# Patient Record
Sex: Male | Born: 1980 | Race: White | Hispanic: No | Marital: Married | Smoking: Never smoker
Health system: Southern US, Community
[De-identification: ages and names within clinical notes are randomized; demographics above are authoritative.]

## PROBLEM LIST (undated history)

## (undated) HISTORY — PX: TONSILLECTOMY: SUR1361

---

## 2009-12-13 ENCOUNTER — Ambulatory Visit: Payer: Self-pay | Admitting: Internal Medicine

## 2011-04-24 ENCOUNTER — Ambulatory Visit: Payer: Self-pay | Admitting: Internal Medicine

## 2014-12-22 ENCOUNTER — Encounter: Payer: Self-pay | Admitting: Internal Medicine

## 2014-12-22 ENCOUNTER — Ambulatory Visit (INDEPENDENT_AMBULATORY_CARE_PROVIDER_SITE_OTHER): Payer: BLUE CROSS/BLUE SHIELD | Admitting: Family Medicine

## 2014-12-22 ENCOUNTER — Encounter: Payer: Self-pay | Admitting: Family Medicine

## 2014-12-22 VITALS — BP 120/64 | HR 72 | Temp 98.1°F | Ht 72.0 in | Wt 217.6 lb

## 2014-12-22 DIAGNOSIS — J3089 Other allergic rhinitis: Secondary | ICD-10-CM | POA: Insufficient documentation

## 2014-12-22 DIAGNOSIS — L309 Dermatitis, unspecified: Secondary | ICD-10-CM | POA: Insufficient documentation

## 2014-12-22 DIAGNOSIS — J029 Acute pharyngitis, unspecified: Secondary | ICD-10-CM

## 2014-12-22 DIAGNOSIS — R61 Generalized hyperhidrosis: Secondary | ICD-10-CM | POA: Insufficient documentation

## 2014-12-22 MED ORDER — PENICILLIN V POTASSIUM 500 MG PO TABS
500.0000 mg | ORAL_TABLET | Freq: Two times a day (BID) | ORAL | Status: DC
Start: 2014-12-22 — End: 2017-02-01

## 2014-12-22 NOTE — Progress Notes (Signed)
Date:  12/22/2014   Name:  Matthew Barnett   DOB:  1981-02-24   MRN:  [id number]  PCP:  Bari Edward, MD    Chief Complaint: Sore Throat   History of Present Illness:  This is a 34 y.o. male with 1d hx sore throat, fever to 102 overnight, no rhinorrhea or cough. Son just dx'd with strep throat at peds this morning (RST negative but signs c/w with strep). States allergic to Ceclor but doesn't remember reaction.  Review of Systems:  Review of Systems  Patient Active Problem List   Diagnosis Date Noted  . 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 Acute dermatitis 12/22/2014  . 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 Authorizing Provider  fluticasone (FLONASE) 50 MCG/ACT nasal spray Place 2 sprays into the nose daily at 2 PM daily at 2 PM. 09/05/12   Historical Provider, MD  penicillin v potassium (VEETID) 500 MG tablet Take 1 tablet (500 mg total) by mouth 2 (two) times daily. 12/22/14   Schuyler Amor, MD    Allergies  Allergen Reactions  . Ceclor [Cefaclor]     Past Surgical History  Procedure Laterality Date  . Tonsillectomy      Social History  Substance Use Topics  . Smoking status: Never Smoker   . Smokeless tobacco: Not on file  . Alcohol Use: No    No family history on file.  Medication list has been reviewed and updated.  Physical Examination: BP 120/64 mmHg  Pulse 72  Temp(Src) 98.1 F (36.7 C)  Ht 6' (1.829 m)  Wt 217 lb 9.6 oz (98.703 kg)  BMI 29.51 kg/m2  SpO2 98%  Physical Exam  Constitutional: He appears well-developed and well-nourished.  HENT:  OP erythema no exudate No sinus tenderness  Pulmonary/Chest: Effort normal and breath sounds normal.  Lymphadenopathy:    He has no cervical adenopathy.  Neurological: He is alert.  Psychiatric: He has a normal mood and affect. His behavior is normal.    Assessment and Plan:  1. Pharyngitis RST negative but son dx'd with strep  throat this AM, will cover with abx given lack of other viral sxs   Return if symptoms worsen or fail to improve.  Dionne Ano. Kingsley Spittle MD [facility]  12/22/2014

## 2015-04-03 ENCOUNTER — Ambulatory Visit
Admission: EM | Admit: 2015-04-03 | Discharge: 2015-04-03 | Disposition: A | Discharge status: Home / Self Care | Payer: BLUE CROSS/BLUE SHIELD | Attending: Family Medicine | Admitting: Family Medicine

## 2015-04-03 ENCOUNTER — Encounter: Payer: Self-pay | Admitting: Emergency Medicine

## 2015-04-03 DIAGNOSIS — J0110 Acute frontal sinusitis, unspecified: Secondary | ICD-10-CM

## 2015-04-03 DIAGNOSIS — J0100 Acute maxillary sinusitis, unspecified: Secondary | ICD-10-CM | POA: Diagnosis not present

## 2015-04-03 MED ORDER — AMOXICILLIN-POT CLAVULANATE 875-125 MG PO TABS: 1.0000 | ORAL_TABLET | Freq: Two times a day (BID) | ORAL | Status: DC

## 2015-04-03 NOTE — ED Notes (Signed)
Patient c/o sinus congestion and pressure and nasal congestion and HAs for a week.

## 2015-04-03 NOTE — ED Provider Notes (Signed)
[facility]  ____________________________________________  Time seen: Approximately 10:02 AM  I have reviewed the triage vital signs and the nursing notes.   HISTORY  Chief Complaint Facial Pain and Nasal Congestion   HPI KEITON COSMA is a 34 y.o. male presents for the complaints of 1-1.5 weeks of runny nose, nasal congestion and sinus pressure. Reports gradual onset of thick greenish nasal drainage. Also reports that now with frequent sinus pressure described as a aching pressure pain at 5 out of 10 around his face. Reports continues to eat and drink well. Denies known fevers. Denies chest pain, shortness breath, abdominal pain, neck pain, back pain, weakness or dizziness.   History reviewed. No pertinent past medical history.  Patient Active Problem List   Diagnosis Date Noted  . 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 Acute dermatitis 12/22/2014  . Hyperhidrosis 12/22/2014    Past Surgical History  Procedure Laterality Date  . Tonsillectomy      Current Outpatient Rx  Name  Route  Sig  Dispense  Refill                          Allergies Ceclor  History reviewed. No pertinent family history.  Social History Social History  Substance Use Topics  . Smoking status: Never Smoker   . Smokeless tobacco: None  . Alcohol Use: No    Review of Systems Constitutional: No fever/chills Eyes: No visual changes. ENT: No sore throat. Positive for runny nose, nasal congestion and sinus pressure. Cardiovascular: Denies chest pain. Respiratory: Denies shortness of breath. Gastrointestinal: No abdominal pain.  No nausea, no vomiting.  No diarrhea.  No constipation. Genitourinary: Negative for dysuria. Musculoskeletal: Negative for back pain. Skin: Negative for rash. Neurological: Negative for headaches, focal weakness or numbness.  10-point ROS otherwise negative.  ____________________________________________   PHYSICAL EXAM:  VITAL SIGNS: ED Triage  Vitals  Enc Vitals Group     BP 04/03/15 0926 131/86 mmHg     Pulse Rate 04/03/15 0926 55     Resp 04/03/15 0926 16     Temp 04/03/15 0926 96.8 F (36 C)     Temp Source 04/03/15 0926 Tympanic     SpO2 04/03/15 0926 100 %     Weight 04/03/15 0926 220 lb (99.791 kg)     Height 04/03/15 0926 6' (1.829 m)     Head Cir --      Peak Flow --      Pain Score --      Pain Loc --      Pain Edu? --      Excl. in GC? --   Constitutional: Alert and oriented. Well appearing and in no acute distress. Eyes: Conjunctivae are normal. PERRL. EOMI. Head: Atraumatic.Moderate tenderness to palpation bilateral frontal and maxillary sinuses. No swelling. No erythema.  Ears: no erythema, normal TMs bilaterally.   Nose:Nasal congestion with bilateral nasal turbinate erythema. Greenish nasal drainage.  Mouth/Throat: Mucous membranes are moist.  Oropharynx non-erythematous.No tonsillar swelling or exudate.  Neck: No stridor.  No cervical spine tenderness to palpation. Hematological/Lymphatic/Immunilogical: No cervical lymphadenopathy. Cardiovascular: Normal rate, regular rhythm. Grossly normal heart sounds.  Good peripheral circulation. Respiratory: Normal respiratory effort.  No retractions. Lungs CTAB.No wheezes, rales or rhonchi. Good air movement.  Gastrointestinal: Soft and nontender. No distention. Normal Bowel sounds.No CVA tenderness. Musculoskeletal: No lower or upper extremity tenderness nor edema.  No joint effusions. Bilateral pedal pulses equal and easily palpated.  Neurologic:  Normal speech and language. No gross focal neurologic deficits are appreciated. No gait instability. Skin:  Skin is warm, dry and intact. No rash noted. Psychiatric: Mood and affect are normal. Speech and behavior are normal.  LABS (all labs ordered are listed, but only abnormal results are displayed)  Labs Reviewed - No data to display ____________________________________________  INITIAL IMPRESSION / ASSESSMENT AND  PLAN / ED COURSE  Pertinent labs & imaging results that were available during my care of the patient were reviewed by me and considered in my medical decision making (see chart for details).  Very well-appearing patient. No acute distress. Presents for the complaints of over a week of runny nose, nasal congestion and sinus pressure. Will treat maxillary and frontal sinusitis with oral Augmentin and encouraged supportive treatments including over-the-counter Tylenol or ibuprofen as needed, rest, fluids and PCP follow-up.  Discussed follow up with Primary care physician this week. Discussed follow up and return parameters including no resolution or any worsening concerns. Patient verbalized understanding and agreed to plan.   ____________________________________________   FINAL CLINICAL IMPRESSION(S) / ED DIAGNOSES  Final diagnoses:  Acute frontal sinusitis, recurrence not specified  Acute maxillary sinusitis, recurrence not specified       Renford DillsLindsey Koty Anctil, NP 04/03/15 1125

## 2015-04-03 NOTE — Discharge Instructions (Signed)
Take medication as prescribed. Take over the counter tylenol or ibuprofen as needed. Rest. Drink plenty.   Follow up with your primary care physician as needed. Return to urgent care as needed for new or worsening concerns.   Sinusitis, Adult Sinusitis is redness, soreness, and inflammation of the paranasal sinuses. Paranasal sinuses are air pockets within the bones of your face. They are located beneath your eyes, in the middle of your forehead, and above your eyes. In healthy paranasal sinuses, mucus is able to drain out, and air is able to circulate through them by way of your nose. However, when your paranasal sinuses are inflamed, mucus and air can become trapped. This can allow bacteria and other germs to grow and cause infection. Sinusitis can develop quickly and last only a short time (acute) or continue over a long period (chronic). Sinusitis that lasts for more than 12 weeks is considered chronic. CAUSES Causes of sinusitis include:  Allergies.  Structural abnormalities, such as displacement of the cartilage that separates your nostrils (deviated septum), which can decrease the air flow through your nose and sinuses and affect sinus drainage.  Functional abnormalities, such as when the small hairs (cilia) that line your sinuses and help remove mucus do not work properly or are not present. SIGNS AND SYMPTOMS Symptoms of acute and chronic sinusitis are the same. The primary symptoms are pain and pressure around the affected sinuses. Other symptoms include:  Upper toothache.  Earache.  Headache.  Bad breath.  Decreased sense of smell and taste.  A cough, which worsens when you are lying flat.  Fatigue.  Fever.  Thick drainage from your nose, which often is green and may contain pus (purulent).  Swelling and warmth over the affected sinuses. DIAGNOSIS Your health care provider will perform a physical exam. During your exam, your health care provider may perform any of the  following to help determine if you have acute sinusitis or chronic sinusitis:  Look in your nose for signs of abnormal growths in your nostrils (nasal polyps).  Tap over the affected sinus to check for signs of infection.  View the inside of your sinuses using an imaging device that has a light attached (endoscope). If your health care provider suspects that you have chronic sinusitis, one or more of the following tests may be recommended:  Allergy tests.  Nasal culture. A sample of mucus is taken from your nose, sent to a lab, and screened for bacteria.  Nasal cytology. A sample of mucus is taken from your nose and examined by your health care provider to determine if your sinusitis is related to an allergy. TREATMENT Most cases of acute sinusitis are related to a viral infection and will resolve on their own within 10 days. Sometimes, medicines are prescribed to help relieve symptoms of both acute and chronic sinusitis. These may include pain medicines, decongestants, nasal steroid sprays, or saline sprays. However, for sinusitis related to a bacterial infection, your health care provider will prescribe antibiotic medicines. These are medicines that will help kill the bacteria causing the infection. Rarely, sinusitis is caused by a fungal infection. In these cases, your health care provider will prescribe antifungal medicine. For some cases of chronic sinusitis, surgery is needed. Generally, these are cases in which sinusitis recurs more than 3 times per year, despite other treatments. HOME CARE INSTRUCTIONS  Drink plenty of water. Water helps thin the mucus so your sinuses can drain more easily.  Use a humidifier.  Inhale steam 3-4 times  a day (for example, sit in the bathroom with the shower running).  Apply a warm, moist washcloth to your face 3-4 times a day, or as directed by your health care provider.  Use saline nasal sprays to help moisten and clean your sinuses.  Take  medicines only as directed by your health care provider.  If you were prescribed either an antibiotic or antifungal medicine, finish it all even if you start to feel better. SEEK IMMEDIATE MEDICAL CARE IF:  You have increasing pain or severe headaches.  You have nausea, vomiting, or drowsiness.  You have swelling around your face.  You have vision problems.  You have a stiff neck.  You have difficulty breathing.   This information is not intended to replace advice given to you by your health care provider. Make sure you discuss any questions you have with your health care provider.   Document Released: 04/18/2005 Document Revised: 05/09/2014 Document Reviewed: 05/03/2011 Elsevier Interactive Patient Education [employer].

## 2016-04-03 ENCOUNTER — Ambulatory Visit
Admission: EM | Admit: 2016-04-03 | Discharge: 2016-04-03 | Disposition: A | Discharge status: Home / Self Care | Payer: BLUE CROSS/BLUE SHIELD | Attending: Family Medicine | Admitting: Family Medicine

## 2016-04-03 ENCOUNTER — Encounter: Payer: Self-pay | Admitting: Gynecology

## 2016-04-03 DIAGNOSIS — J111 Influenza due to unidentified influenza virus with other respiratory manifestations: Secondary | ICD-10-CM

## 2016-04-03 DIAGNOSIS — R69 Illness, unspecified: Secondary | ICD-10-CM | POA: Diagnosis not present

## 2016-04-03 LAB — RAPID INFLUENZA A&B ANTIGENS (ARMC ONLY)
INFLUENZA A (ARMC): NEGATIVE
INFLUENZA B (ARMC): NEGATIVE

## 2016-04-03 LAB — RAPID STREP SCREEN (MED CTR MEBANE ONLY): STREPTOCOCCUS, GROUP A SCREEN (DIRECT): NEGATIVE

## 2016-04-03 MED ORDER — OSELTAMIVIR PHOSPHATE 75 MG PO CAPS: 75.0000 mg | ORAL_CAPSULE | Freq: Two times a day (BID) | ORAL | 0 refills | Status: DC

## 2016-04-03 NOTE — ED Provider Notes (Signed)
[facility] ____________________________________________  Time seen: Approximately 9:55 AM  I have reviewed the triage vital signs and the nursing notes.   HISTORY  Chief Complaint Generalized Body Aches and Sore Throat   HPI Matthew Barnett is a 35 y.o. male presents for the complaint of sudden onset of cough, congestion, sore throat and body aches yesterday afternoon. Patient reports prior to that he felt well. Patient reports that he has recently been around of other people in the community. Patient states that he woke up last night, his wife checked his temperature, and noted to have a 102.4 fever orally. Reports last dose of ibuprofen at approximately 8 AM. Patient reports right now he is feeling better but continues to have some body aches. States minimal sore throat.  Denies chest pain, shortness of breath, abdominal pain, dysuria, neck pain, back pain, extremity pain or extremity swelling.  Bari EdwardLaura Berglund, MD: PCP   History reviewed. No pertinent past medical history.  Patient Active Problem List   Diagnosis Date Noted  . 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 Acute dermatitis 12/22/2014  . Hyperhidrosis 12/22/2014    Past Surgical History:  Procedure Laterality Date  . TONSILLECTOMY       No current facility-administered medications for this encounter.   Current Outpatient Prescriptions:  .  none  Allergies Ceclor [cefaclor]  No family history on file.  Social History Social History  Substance Use Topics  . Smoking status: Never Smoker  . Smokeless tobacco: Never Used  . Alcohol use No    Review of Systems Constitutional:  As above.  Eyes: No visual changes. ENT: As above.  Cardiovascular: Denies chest pain. Respiratory: Denies shortness of breath. Gastrointestinal: No abdominal pain.  No nausea, no vomiting.  No diarrhea.  No constipation. Genitourinary: Negative for dysuria. Musculoskeletal: Negative for back pain. Skin:  Negative for rash. Neurological: Negative for headaches, focal weakness or numbness.  10-point ROS otherwise negative.  ____________________________________________   PHYSICAL EXAM:  VITAL SIGNS: ED Triage Vitals  Enc Vitals Group     BP 04/03/16 0936 121/73     Pulse Rate 04/03/16 0936 89     Resp 04/03/16 0936 18     Temp 04/03/16 0936 97.7 F (36.5 C)     Temp Source 04/03/16 0936 Oral     SpO2 04/03/16 0936 98 %     Weight 04/03/16 0938 220 lb (99.8 kg)     Height 04/03/16 0938 6' (1.829 m)     Head Circumference --      Peak Flow --      Pain Score 04/03/16 0939 3     Pain Loc --      Pain Edu? --      Excl. in GC? --    Constitutional: Alert and oriented. Well appearing and in no acute distress. Eyes: Conjunctivae are normal. PERRL. EOMI. Head: Atraumatic. No sinus tenderness to palpation. No swelling. No erythema.  Ears: no erythema, normal TMs bilaterally.   Nose:Mild nasal congestion.   Mouth/Throat: Mucous membranes are moist. Mild pharyngeal erythema. No tonsillar swelling or exudate.  Neck: No stridor.  No cervical spine tenderness to palpation. Hematological/Lymphatic/Immunilogical: No cervical lymphadenopathy. Cardiovascular: Normal rate, regular rhythm. Grossly normal heart sounds.  Good peripheral circulation. Respiratory: Normal respiratory effort.  No retractions. Lungs CTAB.No wheezes, rales or rhonchi. Good air movement.  Gastrointestinal: Soft and nontender. No CVA tenderness. Musculoskeletal: No lower or upper extremity tenderness nor edema. No cervical, thoracic or lumbar tenderness to palpation. Neurologic:  Normal speech and language. No gross focal neurologic deficits are appreciated. No gait instability.No meningismus.  Skin:  Skin is warm, dry and intact. No rash noted. Psychiatric: Mood and affect are normal. Speech and behavior are normal.  ___________________________________________   LABS (all labs ordered are listed, but only abnormal  results are displayed)  Labs Reviewed  RAPID INFLUENZA A&B ANTIGENS ([facility] ONLY)  RAPID STREP SCREEN (NOT AT [facility])  CULTURE, GROUP A STREP [facility])     PROCEDURES Procedures    INITIAL IMPRESSION / ASSESSMENT AND PLAN / ED COURSE  Pertinent labs & imaging results that were available during my care of the patient were reviewed by me and considered in my medical decision making (see chart for details).  Very well-appearing patient. No acute distress. Presents for complaints of acute onset of cough, congestion, body aches and fever. Reports of 102.4 oral fever last night. Exam reassuring. Suspect flulike illness. Quick strep negative, will culture. Influenza negative. However discussed with patient suspect flulike illness, discussed use of Tamiflu. Patient requests prescription for Tamiflu. Denies need for other prescription medications and states will take over-the-counter medications as needed.Discussed indication, risks and benefits of medications with patient. Encouraged rest, fluids and PCP follow-up as needed.  Discussed follow up with Primary care physician this week. Discussed follow up and return parameters including no resolution or any worsening concerns. Patient verbalized understanding and agreed to plan.   ____________________________________________   FINAL CLINICAL IMPRESSION(S) / ED DIAGNOSES  Final diagnoses:  Influenza-like illness     Discharge Medication List as of 04/03/2016  9:56 AM    START taking these medications   Details  oseltamivir (TAMIFLU) 75 MG capsule Take 1 capsule (75 mg total) by mouth every 12 (twelve) hours., Starting Sun 04/03/2016, Normal        Note: This dictation was prepared with Dragon dictation along with smaller phrase technology. Any transcriptional errors that result from this process are unintentional.    Clinical Course       Renford DillsLindsey Vanita Cannell, NP 04/03/16 1030

## 2016-04-03 NOTE — Discharge Instructions (Signed)
Take medication as prescribed. Rest. Drink plenty of fluids.  ° °Follow up with your primary care physician this week as needed. Return to Urgent care for new or worsening concerns.  ° °

## 2016-04-03 NOTE — ED Triage Notes (Signed)
Patient c/o body ache/ sore throat and fever at home of 102.4 this morning.

## 2016-04-06 ENCOUNTER — Telehealth: Payer: Self-pay | Admitting: Emergency Medicine

## 2016-04-06 LAB — CULTURE, GROUP A STREP (THRC)

## 2016-04-06 NOTE — Telephone Encounter (Signed)
Patient notified that his throat culture was negative.  Patient states that he is feeling better.  Patient to follow-up here or with PCP if symptoms worsen.  Patient verbalized understanding.

## 2017-02-01 ENCOUNTER — Other Ambulatory Visit: Payer: Self-pay | Admitting: Internal Medicine

## 2017-02-01 ENCOUNTER — Encounter: Payer: Self-pay | Admitting: Internal Medicine

## 2017-02-01 ENCOUNTER — Ambulatory Visit (INDEPENDENT_AMBULATORY_CARE_PROVIDER_SITE_OTHER): Payer: BLUE CROSS/BLUE SHIELD | Admitting: Internal Medicine

## 2017-02-01 VITALS — BP 130/86 | HR 67 | Temp 98.1°F | Ht 72.0 in | Wt 224.0 lb

## 2017-02-01 DIAGNOSIS — H6691 Otitis media, unspecified, right ear: Secondary | ICD-10-CM | POA: Diagnosis not present

## 2017-02-01 DIAGNOSIS — H6121 Impacted cerumen, right ear: Secondary | ICD-10-CM | POA: Diagnosis not present

## 2017-02-01 DIAGNOSIS — J0100 Acute maxillary sinusitis, unspecified: Secondary | ICD-10-CM

## 2017-02-01 MED ORDER — AZITHROMYCIN 250 MG PO TABS: ORAL_TABLET | ORAL | 0 refills | Status: DC

## 2017-02-01 MED ORDER — NEOMYCIN-POLYMYXIN-HC 1 % OT SOLN: 3.0000 [drp] | Freq: Three times a day (TID) | OTIC | 0 refills | Status: DC

## 2017-02-01 NOTE — Progress Notes (Signed)
Date:  02/01/2017   Name:  Matthew Barnett   DOB:  1980/05/24   MRN:  [id number]   Chief Complaint: Sinusitis (Having sinus headaches. Last one was a few weeks ago. Blew nose and felt pressure shoot up nose and more headaches. Sometimes having a stuffy nose. No cough. ) and Ear Pain (Right Ear pain- feels clogged. On and off for 3 months. Stated tried putting peroxide in it but still clogged. Hearing less out of the ear. )  Sinusitis  This is a new problem. The current episode started 1 to 4 weeks ago. The problem has been waxing and waning since onset. There has been no fever. Associated symptoms include congestion, headaches and sinus pressure. Pertinent negatives include no chills, ear pain or shortness of breath. Past treatments include oral decongestants. The treatment provided mild relief.     Review of Systems  Constitutional: Negative for chills, fatigue and fever.  HENT: Positive for congestion, hearing loss and sinus pressure. Negative for ear pain and trouble swallowing.   Respiratory: Negative for chest tightness and shortness of breath.   Cardiovascular: Negative for chest pain and palpitations.  Gastrointestinal: Negative for diarrhea and nausea.  Neurological: Positive for headaches.    Patient Active Problem List   Diagnosis Date Noted  . Environmental and seasonal allergies 12/22/2014  . 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 Ceclor [Cefaclor]     Past Surgical History:  Procedure Laterality Date  . TONSILLECTOMY      Social History  Substance Use Topics  . Smoking status: Never Smoker  . Smokeless tobacco: Never Used  . Alcohol use No     Medication list has been reviewed and updated.  PHQ 2/9 Scores 02/01/2017  PHQ - 2 Score 0    Physical Exam  Constitutional: He is oriented to person, place, and time. He appears well-developed and well-nourished.  HENT:  Right Ear:  External ear normal.  Left Ear: Tympanic membrane, external ear and ear canal normal. Tympanic membrane is not erythematous and not retracted.  Nose: Right sinus exhibits frontal sinus tenderness. Right sinus exhibits no maxillary sinus tenderness. Left sinus exhibits no maxillary sinus tenderness and no frontal sinus tenderness.  Mouth/Throat: Uvula is midline and mucous membranes are normal. No oral lesions. Posterior oropharyngeal erythema present. No oropharyngeal exudate.  Impacted cerumen with yellow fluid oozing around on right Flushed with tepid water - large amount of cerumen removed.  Ear canal erythematous, TM scarred but not red/retracted  Cardiovascular: Normal rate, regular rhythm and normal heart sounds.   Pulmonary/Chest: Breath sounds normal. He has no wheezes. He has no rales.  Lymphadenopathy:    He has no cervical adenopathy.  Neurological: He is alert and oriented to person, place, and time.    BP 130/86   Pulse 67   Temp 98.1 F (36.7 C) (Oral)   Ht 6' (1.829 m)   Wt 224 lb (101.6 kg)   SpO2 97%   BMI 30.38 kg/m   Assessment and Plan: 1. Acute non-recurrent maxillary sinusitis - azithromycin (ZITHROMAX Z-PAK) 250 MG tablet; UAD  Dispense: 6 each; Refill: 0  2. Right otitis media, unspecified otitis media type - NEOMYCIN-POLYMYXIN-HYDROCORTISONE (CORTISPORIN) 1 % SOLN OTIC solution; Place 3 drops into the right ear every 8 (eight) hours.  Dispense: 10 mL; Refill: 0  3. Impacted cerumen of right ear Removed easily with flushing -  Meds ordered this encounter  Medications  . azithromycin (ZITHROMAX Z-PAK) 250 MG tablet    Sig: UAD    Dispense:  6 each    Refill:  0  . NEOMYCIN-POLYMYXIN-HYDROCORTISONE (CORTISPORIN) 1 % SOLN OTIC solution    Sig: Place 3 drops into the right ear every 8 (eight) hours.    Dispense:  10 mL    Refill:  0    Partially dictated using Animal nutritionist. Any errors are unintentional.  Bari Edward, MD [facility]  02/01/2017

## 2017-07-25 ENCOUNTER — Ambulatory Visit: Payer: Self-pay | Admitting: Internal Medicine

## 2017-07-26 ENCOUNTER — Ambulatory Visit (INDEPENDENT_AMBULATORY_CARE_PROVIDER_SITE_OTHER): Payer: BLUE CROSS/BLUE SHIELD | Admitting: Internal Medicine

## 2017-07-26 ENCOUNTER — Encounter: Payer: Self-pay | Admitting: Internal Medicine

## 2017-07-26 VITALS — BP 128/82 | HR 69 | Temp 97.7°F | Ht 72.0 in | Wt 224.0 lb

## 2017-07-26 DIAGNOSIS — J3089 Other allergic rhinitis: Secondary | ICD-10-CM | POA: Diagnosis not present

## 2017-07-26 DIAGNOSIS — H6121 Impacted cerumen, right ear: Secondary | ICD-10-CM

## 2017-07-26 NOTE — Progress Notes (Signed)
    Date:  07/26/2017   Name:  Matthew SartoriusRoger D Treanor   DOB:  12/21/1980   MRN:  [id number]   Chief Complaint: Otalgia (Sunday night Left ear has been hurting. Right ear feels like clogging back up. )  Otalgia   There is pain in the right ear. This is a new problem. The current episode started yesterday. The problem has been resolved. There has been no fever. Associated symptoms include headaches. Pertinent negatives include no coughing, ear discharge or sore throat. seasonal allergies      Review of Systems  Constitutional: Negative for chills, fatigue and fever.  HENT: Positive for ear pain. Negative for ear discharge, sinus pressure, sore throat and trouble swallowing.   Respiratory: Negative for cough, chest tightness and shortness of breath.   Cardiovascular: Negative for chest pain and palpitations.  Neurological: Positive for headaches.    Patient Active Problem List   Diagnosis Date Noted  . Environmental and seasonal allergies 12/22/2014  . 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 Ceclor [Cefaclor]     Past Surgical History:  Procedure Laterality Date  . TONSILLECTOMY      Social History   Tobacco Use  . Smoking status: Never Smoker  . Smokeless tobacco: Never Used  Substance Use Topics  . Alcohol use: No    Alcohol/week: 0.0 oz  . Drug use: No     Medication list has been reviewed and updated.  PHQ 2/9 Scores 02/01/2017  PHQ - 2 Score 0    Physical Exam  Constitutional: He is oriented to person, place, and time. He appears well-developed. No distress.  HENT:  Head: Normocephalic and atraumatic.  Left Ear: Tympanic membrane and ear canal normal.  Nose: Right sinus exhibits no maxillary sinus tenderness and no frontal sinus tenderness. Left sinus exhibits no maxillary sinus tenderness and no frontal sinus tenderness.  Mouth/Throat: No posterior oropharyngeal edema or posterior  oropharyngeal erythema.  Excessive cerumen on right  Cardiovascular: Normal rate, regular rhythm and normal heart sounds.  Pulmonary/Chest: Effort normal and breath sounds normal. No respiratory distress. He has no wheezes.  Musculoskeletal: Normal range of motion.  Neurological: He is alert and oriented to person, place, and time.  Skin: Skin is warm and dry. No rash noted.  Psychiatric: He has a normal mood and affect. His behavior is normal. Thought content normal.  Nursing note and vitals reviewed.   BP 128/82   Pulse 69   Temp 97.7 F (36.5 C) (Oral)   Ht 6' (1.829 m)   Wt 224 lb (101.6 kg)   SpO2 99%   BMI 30.38 kg/m   Assessment and Plan: 1. Excessive cerumen in right ear canal rec otc ear wax softener and flush  2. Environmental and seasonal allergies Continue claritin PRN   No orders of the defined types were placed in this encounter.   Partially dictated using Animal nutritionistDragon software. Any errors are unintentional.  Bari EdwardLaura Eriko Economos, MD [facility]  07/26/2017

## 2018-02-10 ENCOUNTER — Encounter: Payer: Self-pay | Admitting: Emergency Medicine

## 2018-02-10 ENCOUNTER — Emergency Department: Payer: BLUE CROSS/BLUE SHIELD

## 2018-02-10 ENCOUNTER — Emergency Department
Admission: EM | Admit: 2018-02-10 | Discharge: 2018-02-10 | Disposition: A | Discharge status: Home / Self Care | Payer: BLUE CROSS/BLUE SHIELD | Attending: Emergency Medicine | Admitting: Emergency Medicine

## 2018-02-10 DIAGNOSIS — Y92002 Bathroom of unspecified non-institutional (private) residence single-family (private) house as the place of occurrence of the external cause: Secondary | ICD-10-CM | POA: Diagnosis not present

## 2018-02-10 DIAGNOSIS — R55 Syncope and collapse: Secondary | ICD-10-CM | POA: Diagnosis not present

## 2018-02-10 DIAGNOSIS — W010XXA Fall on same level from slipping, tripping and stumbling without subsequent striking against object, initial encounter: Secondary | ICD-10-CM | POA: Diagnosis not present

## 2018-02-10 DIAGNOSIS — S0083XA Contusion of other part of head, initial encounter: Secondary | ICD-10-CM | POA: Diagnosis not present

## 2018-02-10 DIAGNOSIS — S0990XA Unspecified injury of head, initial encounter: Secondary | ICD-10-CM

## 2018-02-10 DIAGNOSIS — R569 Unspecified convulsions: Secondary | ICD-10-CM | POA: Diagnosis not present

## 2018-02-10 DIAGNOSIS — Y999 Unspecified external cause status: Secondary | ICD-10-CM | POA: Insufficient documentation

## 2018-02-10 DIAGNOSIS — Y9389 Activity, other specified: Secondary | ICD-10-CM | POA: Insufficient documentation

## 2018-02-10 DIAGNOSIS — M542 Cervicalgia: Secondary | ICD-10-CM | POA: Diagnosis not present

## 2018-02-10 DIAGNOSIS — S0993XA Unspecified injury of face, initial encounter: Secondary | ICD-10-CM | POA: Diagnosis not present

## 2018-02-10 DIAGNOSIS — S199XXA Unspecified injury of neck, initial encounter: Secondary | ICD-10-CM | POA: Diagnosis not present

## 2018-02-10 DIAGNOSIS — S00512A Abrasion of oral cavity, initial encounter: Secondary | ICD-10-CM | POA: Diagnosis not present

## 2018-02-10 LAB — BASIC METABOLIC PANEL
Anion gap: 5 (ref 5–15)
BUN: 24 mg/dL — AB (ref 6–20)
CALCIUM: 8.9 mg/dL (ref 8.9–10.3)
CHLORIDE: 109 mmol/L (ref 98–111)
CO2: 26 mmol/L (ref 22–32)
CREATININE: 1 mg/dL (ref 0.61–1.24)
GFR calc non Af Amer: >60 mL/min (ref >60)
Glucose, Bld: 143 mg/dL — ABNORMAL HIGH (ref 70–99)
Potassium: 3.6 mmol/L (ref 3.5–5.1)
SODIUM: 140 mmol/L (ref 135–145)

## 2018-02-10 LAB — CBC
HEMATOCRIT: 40.5 % (ref 39.0–52.0)
Hemoglobin: 14.2 g/dL (ref 13.0–17.0)
MCH: 31.6 pg (ref 26.0–34.0)
MCHC: 35.1 g/dL (ref 30.0–36.0)
MCV: 90.2 fL (ref 80.0–100.0)
PLATELETS: 215 10*3/uL (ref 150–400)
RBC: 4.49 MIL/uL (ref 4.22–5.81)
RDW: 12.4 % (ref 11.5–15.5)
WBC: 9.7 10*3/uL (ref 4.0–10.5)
nRBC: 0 % (ref 0.0–0.2)

## 2018-02-10 NOTE — ED Notes (Signed)
PT TO CT AT THIS TIME

## 2018-02-10 NOTE — ED Provider Notes (Signed)
[facility] Emergency Department Provider Note   ____________________________________________   First MD Initiated Contact with Patient 02/10/18 [id number]     (approximate)  I have reviewed the triage vital signs and the nursing notes.   HISTORY  Chief Complaint Loss of Consciousness    HPI Matthew Barnett is a 37 y.o. male here for evaluation after he passed out after using the bathroom  Patient reports he got up during the night to have a bowel movement, while he was getting up from the toilet he became very lightheaded and fell forward. On the floor and his wife heard him collapse.  He was able to crawl to the door and open it for her.  He tried to get up once again and then got very lightheaded and fell back and passed out.  Patient reports he is not certain he passed out during the first episode but he is pretty sure he did a second time and his white witnessed him go out for at least a few seconds to a minute.  Reports the front of his head has a small amount of bruising over it, no chest pain or trouble breathing.  No abdominal pain nausea or vomiting.  Ports he had a normal bowel movement and was getting up and became lightheaded.  He also reports he had 2 previous episodes that are about the same where years ago he passed out both times while using the bathroom.   He did not urinate on himself.  No history of any seizures.  Wife reports he had a very brief shaking-like episode but awoke oriented immediately after passing out.  History reviewed. No pertinent past medical history.  Patient Active Problem List   Diagnosis Date Noted  . Environmental and seasonal allergies 12/22/2014  . Hyperhidrosis 12/22/2014    Past Surgical History:  Procedure Laterality Date  . TONSILLECTOMY      Prior to Admission medications   Not on File    Allergies Ceclor [cefaclor]  No family history on file.  Social History Social History   Tobacco Use  .  Smoking status: Never Smoker  . Smokeless tobacco: Never Used  Substance Use Topics  . Alcohol use: No    Alcohol/week: 0.0 standard drinks  . Drug use: No    Review of Systems Constitutional: No fever/chills Eyes: No visual changes. ENT: No sore throat. Cardiovascular: Denies chest pain. Respiratory: Denies shortness of breath. Gastrointestinal: No abdominal pain.   Genitourinary: Negative for dysuria. Musculoskeletal: Negative for back pain.  Reports his neck feels just slightly sore primarily over the left side. Skin: Negative for rash. Neurological: Negative for headaches, areas of focal weakness or numbness.    ____________________________________________   PHYSICAL EXAM:  VITAL SIGNS: ED Triage Vitals  Enc Vitals Group     BP 02/10/18 0156 (!) 142/85     Pulse Rate 02/10/18 0156 70     Resp 02/10/18 0156 20     Temp 02/10/18 0156 98.3 F (36.8 C)     Temp Source 02/10/18 0156 Oral     SpO2 02/10/18 0156 98 %     Weight 02/10/18 0155 220 lb (99.8 kg)     Height 02/10/18 0155 6' (1.829 m)     Head Circumference --      Peak Flow --      Pain Score 02/10/18 0154 2     Pain Loc --      Pain Edu? --  Excl. in GC? --     Constitutional: Alert and oriented. Well appearing and in no acute distress.  Patient is wife both very pleasant. Eyes: Conjunctivae are normal. Head: Atraumatic except for a very small hematoma just superior to the left orbital region without bruising or ecchymosis around the orbits. Nose: No congestion/rhinnorhea.  He is a very small abrasion over the tip of the tongue without dental injury. Mouth/Throat: Mucous membranes are moist. Neck: No stridor.  Cardiovascular: Normal rate, regular rhythm. Grossly normal heart sounds.  Good peripheral circulation. Respiratory: Normal respiratory effort.  No retractions. Lungs CTAB. Gastrointestinal: Soft and nontender. No distention. Musculoskeletal: No lower extremity tenderness nor  edema. Neurologic:  Normal speech and language. No gross focal neurologic deficits are appreciated.  Skin:  Skin is warm, dry and intact. No rash noted. Psychiatric: Mood and affect are normal. Speech and behavior are normal.  ____________________________________________   LABS (all labs ordered are listed, but only abnormal results are displayed)  Labs Reviewed  BASIC METABOLIC PANEL - Abnormal; Notable for the following components:      Result Value   Glucose, Bld 143 (*)    BUN 24 (*)    All other components within normal limits  CBC  URINALYSIS, COMPLETE (UACMP) WITH MICROSCOPIC   ____________________________________________  EKG  Reviewed and entered by me at 2 AM Heart rate 70 QRS 100 QTc 420 Normal sinus rhythm, no evidence of ischemia or ectopy No WPW, prolonged QT or Brugada. ____________________________________________  RADIOLOGY     CT head and neck negative for acute ____________________________________________   PROCEDURES  Procedure(s) performed: None  Procedures  Critical Care performed: No  ____________________________________________   INITIAL IMPRESSION / ASSESSMENT AND PLAN / ED COURSE  Pertinent labs & imaging results that were available during my care of the patient were reviewed by me and considered in my medical decision making (see chart for details).   Patient with one near syncopal and one syncopal episode witnessed by his wife the latter.  Does not appear consistent with a seizure, appears he likely became hypotensive after defecation.  Alert well oriented with normal neurologic exam.  EKG reassuring without cardiac or pulmonary symptoms.  Normal vital signs.  Normal mental status.  Does have a slight abrasion to tip of his tongue which I suspect he sustained when falling in a small amount of hematoma overlying the left superior orbital region ----------------------------------------- 5:26 AM on  02/10/2018 ----------------------------------------- Discussed with patient, will follow up closely with primary provider.  Fully alert and oriented, since ambulated without distress discomfort or near syncope.  Return precautions and treatment recommendations and follow-up discussed with the patient who is agreeable with the plan.    ____________________________________________   FINAL CLINICAL IMPRESSION(S) / ED DIAGNOSES  Final diagnoses:  Vasovagal syncope  Closed head injury, initial encounter        Note:  This document was prepared using Dragon voice recognition software and may include unintentional dictation errors       Sharyn Creamer, MD 02/10/18 [phone]

## 2018-02-10 NOTE — ED Triage Notes (Signed)
Pt arrived from home. Pt was in bathroom having a bowel movement and wife heard pt collapse. Pt was found on floor convulsing and was unresponsive for 2 approximately 2 minutes. Pt arrives a & o x 4 with hematoma to forehead. Pt reports minimal pain. No neuro deficits at this time.

## 2018-04-16 ENCOUNTER — Encounter: Payer: Self-pay | Admitting: Internal Medicine

## 2018-04-16 ENCOUNTER — Ambulatory Visit (INDEPENDENT_AMBULATORY_CARE_PROVIDER_SITE_OTHER): Payer: BLUE CROSS/BLUE SHIELD | Admitting: Internal Medicine

## 2018-04-16 VITALS — BP 126/72 | HR 84 | Ht 72.0 in | Wt 233.0 lb

## 2018-04-16 DIAGNOSIS — R030 Elevated blood-pressure reading, without diagnosis of hypertension: Secondary | ICD-10-CM | POA: Diagnosis not present

## 2018-04-16 DIAGNOSIS — Z23 Encounter for immunization: Secondary | ICD-10-CM | POA: Diagnosis not present

## 2018-04-16 NOTE — Progress Notes (Signed)
    Date:  04/16/2018   Name:  Matthew SartoriusRoger D Barnett   DOB:  08/03/1980   MRN:  [id number]   Chief Complaint: Hypertension (Had blood drawn last week and BP was drawn after and it was elevated. )  Hypertension  This is a new problem. The problem has been resolved since onset. Pertinent negatives include no headaches, palpitations, peripheral edema or shortness of breath. There are no associated agents to hypertension. Past treatments include nothing.  [facility]95 but cam down to 138/88. He checked his BP at work several times and it was normal.  He feels well and believes it was just situational.  Review of Systems  Constitutional: Negative for chills, fatigue and fever.  Respiratory: Negative for chest tightness and shortness of breath.   Cardiovascular: Negative for palpitations and leg swelling.  Neurological: Negative for dizziness, light-headedness and headaches.    Patient Active Problem List   Diagnosis Date Noted  . Environmental and seasonal allergies 12/22/2014  . Hyperhidrosis 12/22/2014    Allergies  Allergen Reactions  . Ceclor [Cefaclor]     Past Surgical History:  Procedure Laterality Date  . TONSILLECTOMY      Social History   Tobacco Use  . Smoking status: Never Smoker  . Smokeless tobacco: Never Used  Substance Use Topics  . Alcohol use: No    Alcohol/week: 0.0 standard drinks  . Drug use: No     Medication list has been reviewed and updated.  No outpatient medications have been marked as taking for the 04/16/18 encounter (Office Visit) with Reubin MilanBerglund, Angelle Isais H, MD.    [facility] 04/16/2018 02/01/2017  PHQ - 2 Score 0 0    Physical Exam Vitals signs and nursing note reviewed.  Constitutional:      General: He is not in acute distress.    Appearance: He is well-developed.  HENT:     Head: Normocephalic and atraumatic.  Eyes:     Pupils: Pupils are equal, round, and reactive to light.  Neck:   Musculoskeletal: Normal range of motion and neck supple.     Vascular: No carotid bruit.  Cardiovascular:     Rate and Rhythm: Normal rate and regular rhythm.     Pulses: Normal pulses.  Pulmonary:     Effort: Pulmonary effort is normal. No respiratory distress.     Breath sounds: Normal breath sounds.  Musculoskeletal: Normal range of motion.     Right lower leg: No edema.     Left lower leg: No edema.  Skin:    General: Skin is warm and dry.  Neurological:     Mental Status: He is alert and oriented to person, place, and time.     BP 126/72 (BP Location: Right Arm, Patient Position: Sitting, Cuff Size: Normal)   Pulse 84   Ht 6' (1.829 m)   Wt 233 lb (105.7 kg)   SpO2 98%   BMI 31.60 kg/m   Assessment and Plan: 1. Elevated blood pressure, situational Resolved, no need for intervention  2. Need for immunization against influenza - Flu Vaccine QUAD 36+ mos IM   Partially dictated using Animal nutritionistDragon software. Any errors are unintentional.  Bari EdwardLaura Arabella Revelle, MD [facility]  04/16/2018

## 2019-05-09 IMAGING — CT CT CERVICAL SPINE W/O CM
4 of 7 series · 13 of 33 positions shown, 14 images · non-contrast
Comparison: None.

CLINICAL DATA: Patient collapse in was found convulsion and
unresponsive for about 2 minutes. Hematoma to the forehead.

EXAM:
CT HEAD WITHOUT CONTRAST
CT CERVICAL SPINE WITHOUT CONTRAST
TECHNIQUE: Multidetector CT imaging of the head and cervical spine was
performed following the standard protocol without intravenous
contrast. Multiplanar CT image reconstructions of the cervical spine
were also generated.

[Series 5: coronal soft tissue · coronal · 0.30mm/px · 2 of 64 slices shown]
[im 22/64  bone]
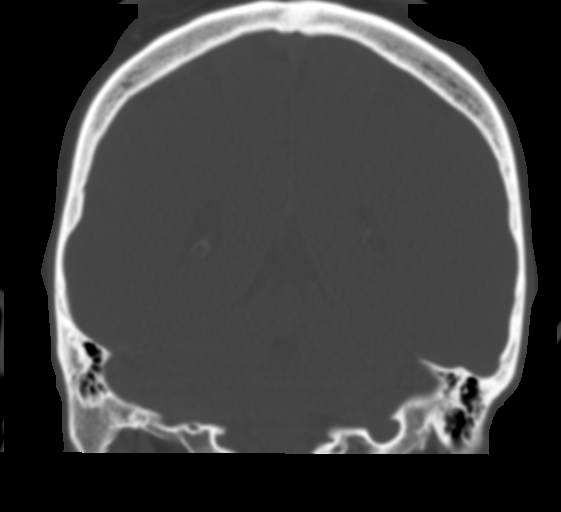
[im 43/64  bone]
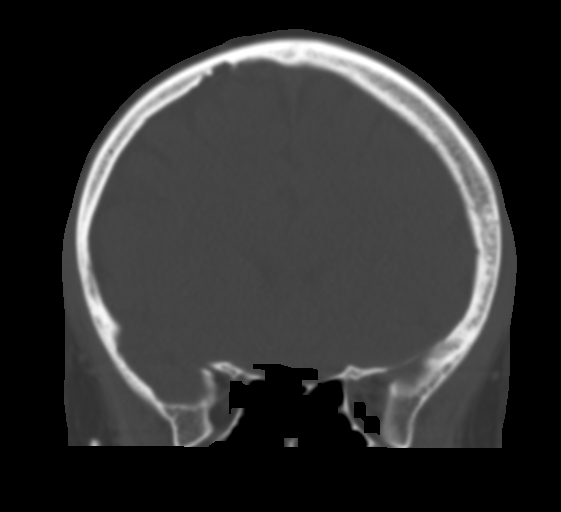

[Series 7: c spine soft · axial · 0.31mm/px · z∈[-200,-130]mm · 3 of 89 slices shown]
[im 18/89  soft-tissue]
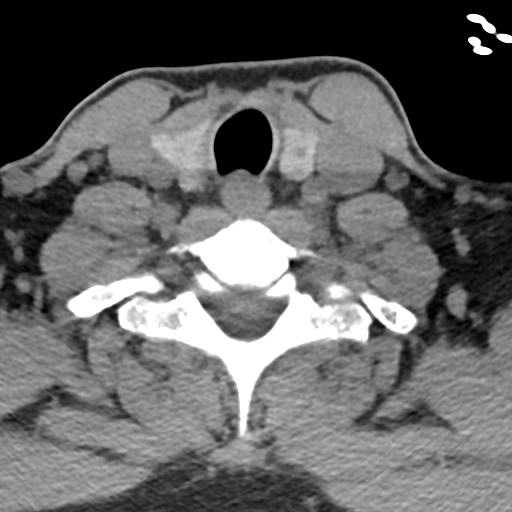
[im 36/89  soft-tissue]
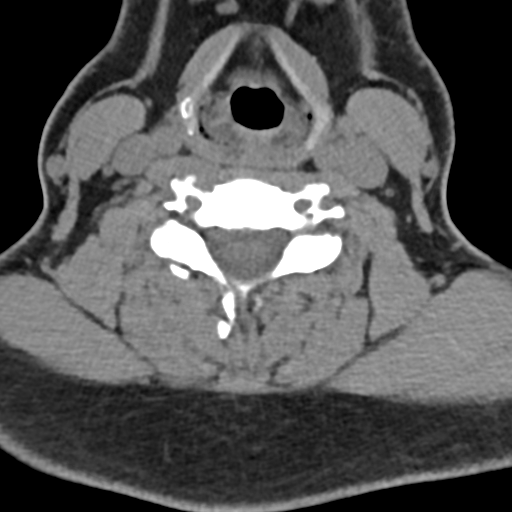
[im 53/89  soft-tissue]
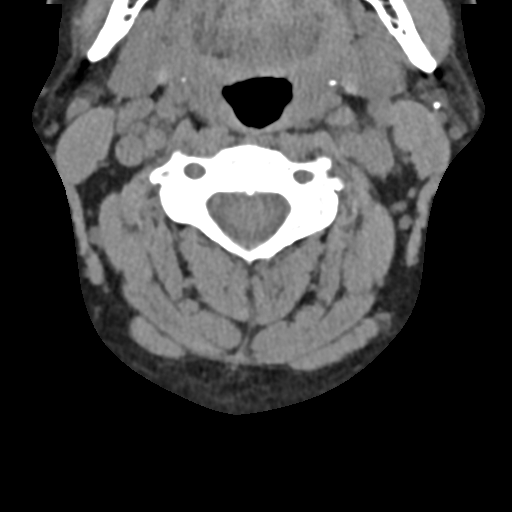

[Series 8: sagittal bone · sagittal · 0.24mm/px · 4 of 44 slices shown]
[im 9/44  bone]
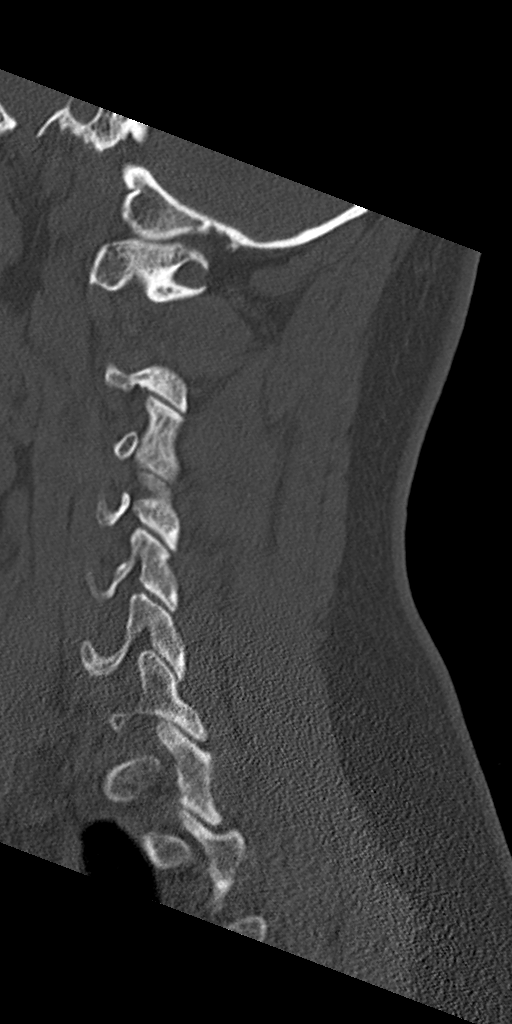
[im 18/44  bone]
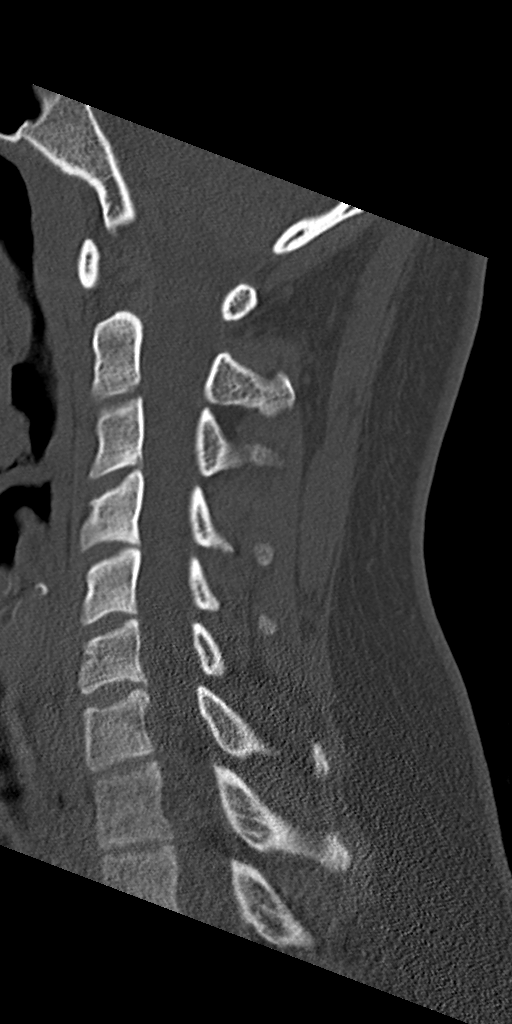
[im 26/44  bone]
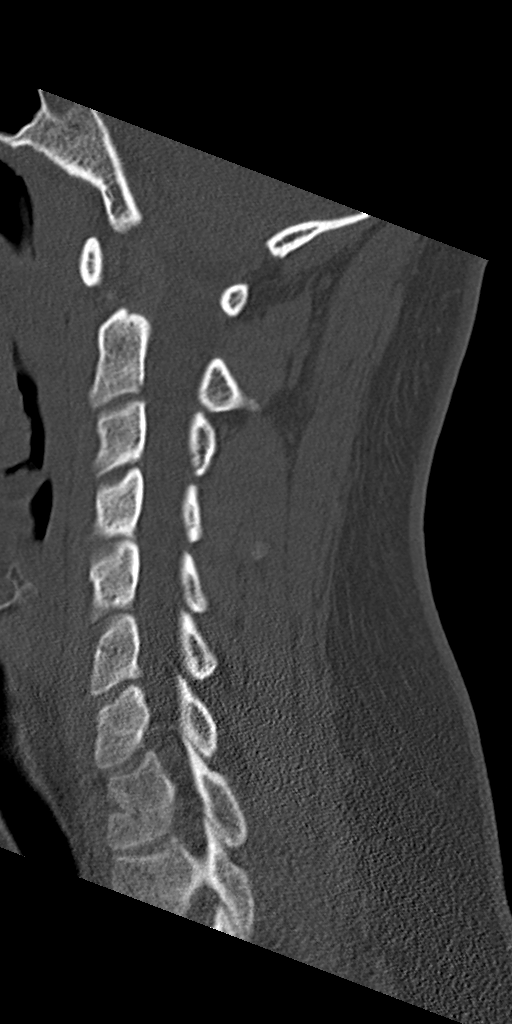
[im 35/44  bone]
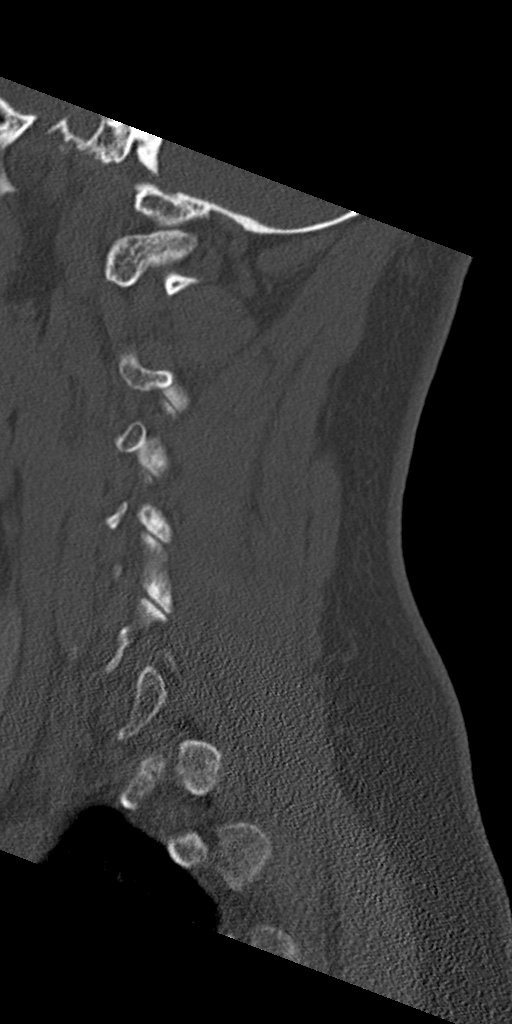

[Series 10: orthogonal bone · axial · 0.17mm/px · z∈[-212,-108]mm · 4 of 94 slices shown, 5 images]
[im 19/94  soft-tissue]
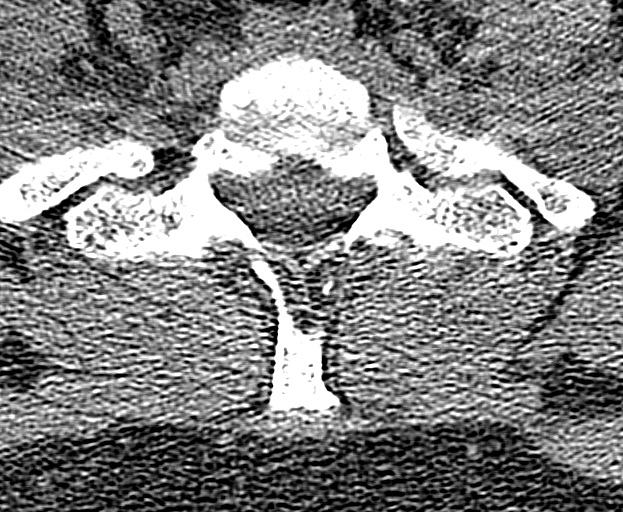
[im 19/94  bone]
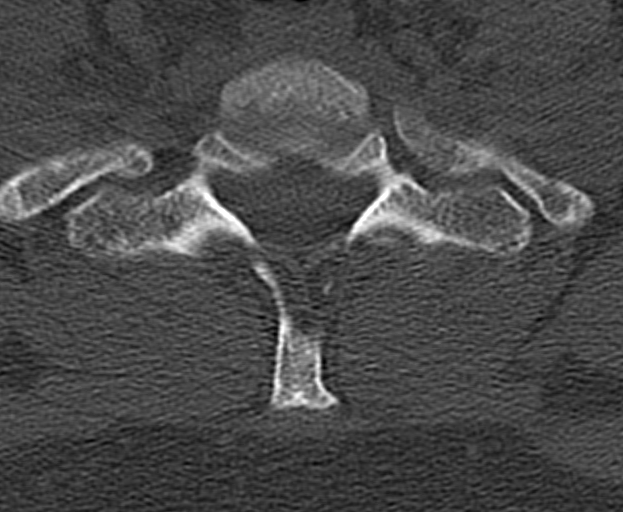
[im 38/94  bone]
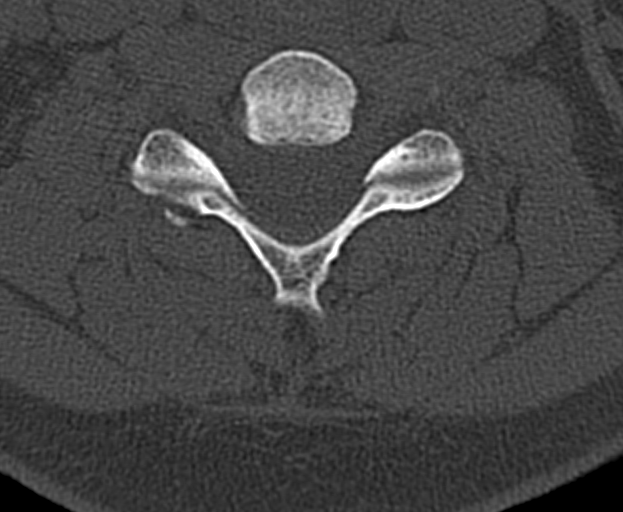
[im 56/94  bone]
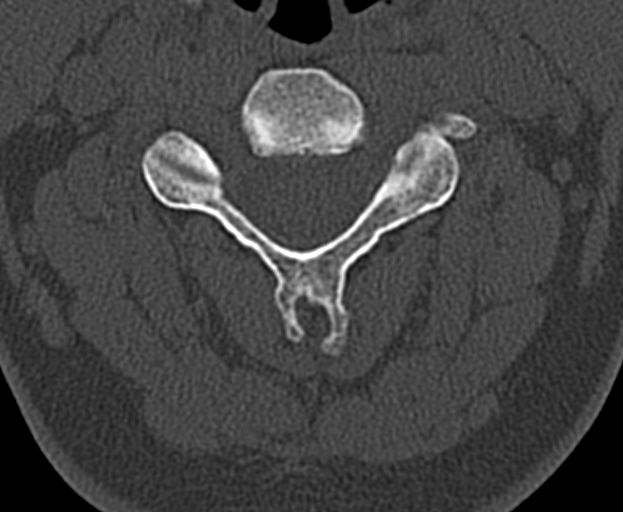
[im 75/94  bone]
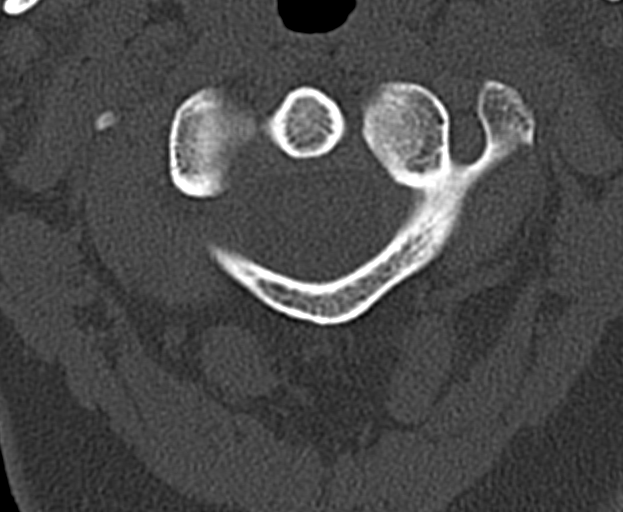

[13 of 33 positions shown; findings below may reference images not displayed]

FINDINGS: CT HEAD FINDINGS

Brain: No evidence of acute infarction, hemorrhage, hydrocephalus,
extra-axial collection or mass lesion/mass effect.

Vascular: No hyperdense vessel or unexpected calcification.

Skull: Normal. Negative for fracture or focal lesion.

Sinuses/Orbits: Mucosal thickening in the paranasal sinuses. No
acute air-fluid levels. Mastoid air cells are clear.

Other: Subcutaneous scalp nodules likely representing Scott cysts.

CT CERVICAL SPINE FINDINGS

Alignment: Straightening of usual cervical lordosis. This is likely
due to patient positioning but ligamentous injury or muscle spasm
could also have this appearance and are not excluded. No anterior
subluxation. Normal alignment of the facet joints. C1-2 articulation
appears intact.

Skull base and vertebrae: Skull base appears intact. No vertebral
compression deformities. No focal bone lesion or bone destruction.

Soft tissues and spinal canal: No prevertebral soft tissue swelling.
No abnormal paraspinal soft tissue mass or infiltration.

Disc levels:  Intervertebral disc space heights are preserved.

Upper chest: Lung apices are clear.

Other: Calcifications in the left parotid gland possibly
representing salivary stones.
IMPRESSION: 1. HEAD: No acute intracranial abnormalities.
2. CERVICAL SPINE: Nonspecific straightening of usual cervical
lordosis. No acute displaced fractures identified.

## 2019-12-25 ENCOUNTER — Other Ambulatory Visit: Payer: BLUE CROSS/BLUE SHIELD

## 2019-12-25 ENCOUNTER — Other Ambulatory Visit: Payer: Self-pay | Admitting: Radiology

## 2019-12-25 DIAGNOSIS — Z20822 Contact with and (suspected) exposure to covid-19: Secondary | ICD-10-CM

## 2019-12-27 LAB — SARS-COV-2, NAA 2 DAY TAT

## 2019-12-27 LAB — NOVEL CORONAVIRUS, NAA: SARS-CoV-2, NAA: NOT DETECTED

## 2019-12-30 ENCOUNTER — Telehealth: Payer: BC Managed Care – PPO | Admitting: Emergency Medicine

## 2019-12-30 DIAGNOSIS — R0981 Nasal congestion: Secondary | ICD-10-CM

## 2019-12-30 MED ORDER — FLUTICASONE PROPIONATE 50 MCG/ACT NA SUSP: 2.0000 | Freq: Every day | NASAL | 0 refills | Status: DC

## 2019-12-30 MED ORDER — LEVOCETIRIZINE DIHYDROCHLORIDE 5 MG PO TABS: 5.0000 mg | ORAL_TABLET | Freq: Every evening | ORAL | 0 refills | Status: DC

## 2019-12-30 NOTE — Progress Notes (Signed)
E visit for Allergic Rhinitis We are sorry that you are not feeling well.  Here is how we plan to help!  Based on what you have shared with me it looks like you have Allergic Rhinitis.  Rhinitis is when a reaction occurs that causes nasal congestion, runny nose, sneezing, and itching.  Most types of rhinitis are caused by an inflammation and are associated with symptoms in the eyes ears or throat. There are several types of rhinitis.  The most common are acute rhinitis, which is usually caused by a viral illness, allergic or seasonal rhinitis, and nonallergic or year-round rhinitis.  Nasal allergies occur certain times of the year.  Allergic rhinitis is caused when allergens in the air trigger the release of histamine in the body.  Histamine causes itching, swelling, and fluid to build up in the fragile linings of the nasal passages, sinuses and eyelids.  An itchy nose and clear discharge are common.  I recommend the following over the counter treatments: Xyzal 5 mg take 1 tablet daily  I also would recommend a nasal spray: Flonase 2 sprays into each nostril once daily    HOME CARE:   You can use an over-the-counter saline nasal spray as needed  Avoid areas where there is heavy dust, mites, or molds  Stay indoors on windy days during the pollen season  Keep windows closed in home, at least in bedroom; use air conditioner.  Use high-efficiency house air filter  Keep windows closed in car, turn AC on re-circulate  Avoid playing out with dog during pollen season  GET HELP RIGHT AWAY IF:   If your symptoms do not improve within 10 days  You become short of breath  You develop yellow or green discharge from your nose for over 3 days  You have coughing fits  MAKE SURE YOU:   Understand these instructions  Will watch your condition  Will get help right away if you are not doing well or get worse  Thank you for choosing an e-visit. Your e-visit answers were reviewed by a  board certified advanced clinical practitioner to complete your personal care plan. Depending upon the condition, your plan could have included both over the counter or prescription medications. Please review your pharmacy choice. Be sure that the pharmacy you have chosen is open so that you can pick up your prescription now.  If there is a problem you may message your provider in MyChart to have the prescription routed to another pharmacy. Your safety is important to [place]. If you have drug allergies check your prescription carefully.  For the next 24 hours, you can use MyChart to ask questions about today's visit, request a non-urgent call back, or ask for a work or school excuse from your e-visit provider. You will get an email in the next two days asking about your experience. I hope that your e-visit has been valuable and will speed your recovery.    **Please do not respond to this message unless you have follow up questions.** Greater than 5 but less than 10 minutes spent researching, coordinating, and implementing care for this patient today

## 2020-11-08 DIAGNOSIS — H60501 Unspecified acute noninfective otitis externa, right ear: Secondary | ICD-10-CM | POA: Diagnosis not present

## 2020-11-08 DIAGNOSIS — U071 COVID-19: Secondary | ICD-10-CM | POA: Diagnosis not present

## 2021-02-05 DIAGNOSIS — D225 Melanocytic nevi of trunk: Secondary | ICD-10-CM | POA: Diagnosis not present

## 2021-02-05 DIAGNOSIS — D2271 Melanocytic nevi of right lower limb, including hip: Secondary | ICD-10-CM | POA: Diagnosis not present

## 2021-02-05 DIAGNOSIS — L538 Other specified erythematous conditions: Secondary | ICD-10-CM | POA: Diagnosis not present

## 2021-02-05 DIAGNOSIS — D2261 Melanocytic nevi of right upper limb, including shoulder: Secondary | ICD-10-CM | POA: Diagnosis not present

## 2021-02-05 DIAGNOSIS — L918 Other hypertrophic disorders of the skin: Secondary | ICD-10-CM | POA: Diagnosis not present

## 2021-02-05 DIAGNOSIS — D2262 Melanocytic nevi of left upper limb, including shoulder: Secondary | ICD-10-CM | POA: Diagnosis not present

## 2021-03-12 DIAGNOSIS — L7211 Pilar cyst: Secondary | ICD-10-CM | POA: Diagnosis not present

## 2021-03-12 DIAGNOSIS — L298 Other pruritus: Secondary | ICD-10-CM | POA: Diagnosis not present

## 2021-03-30 ENCOUNTER — Other Ambulatory Visit: Payer: Self-pay

## 2021-03-30 ENCOUNTER — Encounter: Payer: Self-pay | Admitting: Internal Medicine

## 2021-03-30 ENCOUNTER — Ambulatory Visit: Payer: BC Managed Care – PPO | Admitting: Internal Medicine

## 2021-03-30 VITALS — BP 124/80 | HR 75 | Temp 97.5°F | Resp 17 | Ht 72.0 in | Wt 240.8 lb

## 2021-03-30 DIAGNOSIS — Z114 Encounter for screening for human immunodeficiency virus [HIV]: Secondary | ICD-10-CM

## 2021-03-30 DIAGNOSIS — E6609 Other obesity due to excess calories: Secondary | ICD-10-CM

## 2021-03-30 DIAGNOSIS — Z6832 Body mass index (BMI) 32.0-32.9, adult: Secondary | ICD-10-CM

## 2021-03-30 DIAGNOSIS — Z1159 Encounter for screening for other viral diseases: Secondary | ICD-10-CM

## 2021-03-30 DIAGNOSIS — R7309 Other abnormal glucose: Secondary | ICD-10-CM | POA: Diagnosis not present

## 2021-03-30 DIAGNOSIS — Z0001 Encounter for general adult medical examination with abnormal findings: Secondary | ICD-10-CM

## 2021-03-30 NOTE — Assessment & Plan Note (Signed)
Encouraged diet and exercise for weight loss ?

## 2021-03-30 NOTE — Patient Instructions (Signed)

## 2021-03-30 NOTE — Progress Notes (Signed)
HPI  Pt presents to the clinic today to establish care. He has not had a PCP in a few years.  Flu: 04/2018 Tetanus: unsure Covid: Pfizer x 3 Vision screening: as needed Dentist: biannually  Diet: He does eat meat. He consumes fruits and veggies. He does eat some fried foods. He drinks mostly water, coffee, dt. Soda. Exercise: Walking, weights  No past medical history on file.  No current outpatient medications on file.   No current facility-administered medications for this visit.    Allergies  Allergen Reactions   Ceclor [Cefaclor]     No family history on file.  Social History   Socioeconomic History   Marital status: Married    Spouse name: Not on file   Number of children: Not on file   Years of education: Not on file   Highest education level: Not on file  Occupational History   Not on file  Tobacco Use   Smoking status: Never   Smokeless tobacco: Never  Vaping Use   Vaping Use: Never used  Substance and Sexual Activity   Alcohol use: Yes    Comment: occasional   Drug use: No   Sexual activity: Not on file  Other Topics Concern   Not on file  Social History Narrative   Not on file   Social Determinants of Health   Financial Resource Strain: Not on file  Food Insecurity: Not on file  Transportation Needs: Not on file  Physical Activity: Not on file  Stress: Not on file  Social Connections: Not on file  Intimate Partner Violence: Not on file    ROS:  Constitutional: Denies fever, malaise, fatigue, headache or abrupt weight changes.  HEENT: Denies eye pain, eye redness, ear pain, ringing in the ears, wax buildup, runny nose, nasal congestion, bloody nose, or sore throat. Respiratory: Denies difficulty breathing, shortness of breath, cough or sputum production.   Cardiovascular: Denies chest pain, chest tightness, palpitations or swelling in the hands or feet.  Gastrointestinal: Denies abdominal pain, bloating, constipation, diarrhea or blood in  the stool.  GU: Denies frequency, urgency, pain with urination, blood in urine, odor or discharge. Musculoskeletal: Denies decrease in range of motion, difficulty with gait, muscle pain or joint pain and swelling.  Skin: Denies redness, rashes, lesions or ulcercations.  Neurological: Denies dizziness, difficulty with memory, difficulty with speech or problems with balance and coordination.  Psych: Denies anxiety, depression, SI/HI.  No other specific complaints in a complete review of systems (except as listed in HPI above).  PE:  BP 124/80 (BP Location: Right Arm, Patient Position: Sitting, Cuff Size: Normal)   Pulse 75   Temp (!) 97.5 F (36.4 C) (Temporal)   Resp 17   Ht 6' (1.829 m)   Wt 240 lb 12.8 oz (109.2 kg)   SpO2 99%   BMI 32.66 kg/m  Wt Readings from Last 3 Encounters:  03/30/21 240 lb 12.8 oz (109.2 kg)  04/16/18 233 lb (105.7 kg)  02/10/18 220 lb (99.8 kg)    General: Appears his stated age, obese, in NAD. HEENT: Head: normal shape and size; Eyes: sclera white and EOMs intact;  Neck: Neck supple, trachea midline. No masses, lumps or thyromegaly present.  Cardiovascular: Normal rate and rhythm. S1,S2 noted.  No murmur, rubs or gallops noted. No JVD or BLE edema.  Pulmonary/Chest: Normal effort and positive vesicular breath sounds. No respiratory distress. No wheezes, rales or ronchi noted.  Abdomen: Soft and nontender. Normal bowel sounds, no bruits noted.  No distention or masses noted. Liver, spleen and kidneys non palpable. Musculoskeletal: Strength 5/5 BUE/BLE. No difficulty with gait.  Neurological: Alert and oriented. Cranial nerves II-XII grossly intact. Coordination normal.  Psychiatric: Mood and affect normal. Behavior is normal. Judgment and thought content normal.    BMET    Component Value Date/Time   NA 140 02/10/2018 0159   K 3.6 02/10/2018 0159   CL 109 02/10/2018 0159   CO2 26 02/10/2018 0159   GLUCOSE 143 (H) 02/10/2018 0159   BUN 24 (H)  02/10/2018 0159   CREATININE 1.00 02/10/2018 0159   CALCIUM 8.9 02/10/2018 0159   GFRNONAA >60 02/10/2018 0159   GFRAA >60 02/10/2018 0159    Lipid Panel  No results found for: CHOL, TRIG, HDL, CHOLHDL, VLDL, LDLCALC  CBC    Component Value Date/Time   WBC 9.7 02/10/2018 0159   RBC 4.49 02/10/2018 0159   HGB 14.2 02/10/2018 0159   HCT 40.5 02/10/2018 0159   PLT 215 02/10/2018 0159   MCV 90.2 02/10/2018 0159   MCH 31.6 02/10/2018 0159   MCHC 35.1 02/10/2018 0159   RDW 12.4 02/10/2018 0159    Hgb A1C No results found for: HGBA1C   Assessment and Plan:  Preventative Health Maintenance:  He declines flu shot today He declines tetanus booster Encouraged him to get his covid booster Encouraged him to consume a balanced diet and exercise regimen Advised him to see an eye doctor and dentist annually Will check CBC, CMET, Lipid, A1C, HIV and Hep C today  RTC in 1 year, sooner if needed Nicki Reaper, NP This visit occurred during the SARS-CoV-2 public health emergency.  Safety protocols were in place, including screening questions prior to the visit, additional usage of staff PPE, and extensive cleaning of exam room while observing appropriate contact time as indicated for disinfecting solutions.

## 2021-03-31 LAB — HIV ANTIBODY (ROUTINE TESTING W REFLEX): HIV 1&2 Ab, 4th Generation: NONREACTIVE

## 2021-03-31 LAB — CBC
HCT: 47.7 % (ref 38.5–50.0)
Hemoglobin: 16.6 g/dL (ref 13.2–17.1)
MCH: 31.9 pg (ref 27.0–33.0)
MCHC: 34.8 g/dL (ref 32.0–36.0)
MCV: 91.6 fL (ref 80.0–100.0)
MPV: 10.8 fL (ref 7.5–12.5)
Platelets: 271 10*3/uL (ref 140–400)
RBC: 5.21 10*6/uL (ref 4.20–5.80)
RDW: 12.7 % (ref 11.0–15.0)
WBC: 8.3 10*3/uL (ref 3.8–10.8)

## 2021-03-31 LAB — COMPLETE METABOLIC PANEL WITH GFR
AG Ratio: 1.5 (calc) (ref 1.0–2.5)
ALT: 46 U/L (ref 9–46)
AST: 27 U/L (ref 10–40)
Albumin: 4.6 g/dL (ref 3.6–5.1)
Alkaline phosphatase (APISO): 66 U/L (ref 36–130)
BUN: 14 mg/dL (ref 7–25)
CO2: 26 mmol/L (ref 20–32)
Calcium: 9.6 mg/dL (ref 8.6–10.3)
Chloride: 104 mmol/L (ref 98–110)
Creat: 0.96 mg/dL (ref 0.60–1.29)
Globulin: 3.1 g/dL (calc) (ref 1.9–3.7)
Glucose, Bld: 79 mg/dL (ref 65–99)
Potassium: 4.3 mmol/L (ref 3.5–5.3)
Sodium: 140 mmol/L (ref 135–146)
Total Bilirubin: 0.5 mg/dL (ref 0.2–1.2)
Total Protein: 7.7 g/dL (ref 6.1–8.1)
eGFR: 102 mL/min/{1.73_m2} (ref >=60)

## 2021-03-31 LAB — HEMOGLOBIN A1C
Hgb A1c MFr Bld: 5.4 % of total Hgb (ref <5.7)
Mean Plasma Glucose: 108 mg/dL
eAG (mmol/L): 6 mmol/L

## 2021-03-31 LAB — LIPID PANEL
Cholesterol: 247 mg/dL — ABNORMAL HIGH (ref <200)
HDL: 46 mg/dL (ref >=40)
LDL Cholesterol (Calc): 174 mg/dL (calc) — ABNORMAL HIGH
Non-HDL Cholesterol (Calc): 201 mg/dL (calc) — ABNORMAL HIGH (ref <130)
Total CHOL/HDL Ratio: 5.4 (calc) — ABNORMAL HIGH (ref <5.0)
Triglycerides: 133 mg/dL (ref <150)

## 2021-03-31 LAB — HEPATITIS C ANTIBODY
Hepatitis C Ab: NONREACTIVE
SIGNAL TO CUT-OFF: 0.04 (ref <1.00)

## 2022-06-27 ENCOUNTER — Encounter: Payer: Self-pay | Admitting: Internal Medicine

## 2022-06-27 ENCOUNTER — Ambulatory Visit: Payer: BC Managed Care – PPO | Admitting: Internal Medicine

## 2022-06-27 VITALS — BP 126/78 | HR 91 | Temp 97.8°F | Wt 257.0 lb

## 2022-06-27 DIAGNOSIS — R52 Pain, unspecified: Secondary | ICD-10-CM | POA: Diagnosis not present

## 2022-06-27 DIAGNOSIS — J101 Influenza due to other identified influenza virus with other respiratory manifestations: Secondary | ICD-10-CM

## 2022-06-27 DIAGNOSIS — R509 Fever, unspecified: Secondary | ICD-10-CM | POA: Diagnosis not present

## 2022-06-27 NOTE — Progress Notes (Signed)
Subjective:    Patient ID: Matthew Barnett, male    DOB: 12-23-1980, 42 y.o.   MRN: [id number]  HPI  Patient presents to clinic today with complaint of  fatigue, fever, sinus pressure, ear pressure, nasal congestion and cough.  This started 1 week ago. He is blowing clear mucous out of his nose. The cough is mostly non productive.  He denies headache, runny nose, ear pain, sore throat, shortness of breath, nausea or vomiting.  He has tried Sudafed, Dayquil and Ibuprofen with minimal relief of symptoms.  He has not had sick contacts that he is aware of.  Review of Systems     No past medical history on file.  No current outpatient medications on file.   No current facility-administered medications for this visit.    Allergies  Allergen Reactions   Ceclor [Cefaclor]     No family history on file.  Social History   Socioeconomic History   Marital status: Married    Spouse name: Not on file   Number of children: Not on file   Years of education: Not on file   Highest education level: Not on file  Occupational History   Not on file  Tobacco Use   Smoking status: Never   Smokeless tobacco: Never  Vaping Use   Vaping Use: Never used  Substance and Sexual Activity   Alcohol use: Yes    Comment: occasional   Drug use: No   Sexual activity: Not on file  Other Topics Concern   Not on file  Social History Narrative   Not on file   Social Determinants of Health   Financial Resource Strain: Not on file  Food Insecurity: Not on file  Transportation Needs: Not on file  Physical Activity: Not on file  Stress: Not on file  Social Connections: Not on file  Intimate Partner Violence: Not on file     Constitutional: Patient reports fever.  Denies malaise, fatigue, headache or abrupt weight changes.  HEENT: Patient reports ear pressure, nasal congestion.  Denies eye pain, eye redness, ear pain, ringing in the ears, wax buildup, runny nose, bloody nose, or sore  throat. Respiratory: Patient reports cough.  Denies difficulty breathing, shortness of breath, or sputum production.   Cardiovascular: Denies chest pain, chest tightness, palpitations or swelling in the hands or feet.  Gastrointestinal: Patient reports diarrhea.  Denies abdominal pain, bloating, constipation, or blood in the stool.  Skin: Denies redness, rashes, lesions or ulcercations.   No other specific complaints in a complete review of systems (except as listed in HPI above).  Objective:   Physical Exam   BP 126/78 (BP Location: Right Arm, Patient Position: Sitting, Cuff Size: Large)   Pulse 91   Temp 97.8 F (36.6 C) (Temporal)   Wt 257 lb (116.6 kg)   SpO2 98%   BMI 34.86 kg/m   Wt Readings from Last 3 Encounters:  03/30/21 240 lb 12.8 oz (109.2 kg)  04/16/18 233 lb (105.7 kg)  02/10/18 220 lb (99.8 kg)    General: Appears his stated age, obese, in NAD. Skin: Warm, dry and intact. No rashes noted. HEENT: Head: normal shape and size, no sinus tenderness noted; Eyes: sclera white, no icterus, conjunctiva pink, PERRLA and EOMs intact; Ears: Tm's gray and intact, normal light reflex; Nose: mucosa pink and moist, septum midline; Throat/Mouth: Teeth present, mucosa pink and moist, no exudate, lesions or ulcerations noted.  Neck: No adenopathy noted. Cardiovascular: Normal rate and rhythm. S1,S2 noted.  No murmur, rubs or gallops noted.  Pulmonary/Chest: Normal effort and positive vesicular breath sounds. No respiratory distress. No wheezes, rales or ronchi noted.  Neurological: Alert and oriented.    BMET    Component Value Date/Time   NA 140 03/30/2021 1424   K 4.3 03/30/2021 1424   CL 104 03/30/2021 1424   CO2 26 03/30/2021 1424   GLUCOSE 79 03/30/2021 1424   BUN 14 03/30/2021 1424   CREATININE 0.96 03/30/2021 1424   CALCIUM 9.6 03/30/2021 1424   GFRNONAA >60 02/10/2018 0159   GFRAA >60 02/10/2018 0159    Lipid Panel     Component Value Date/Time   CHOL 247  (H) 03/30/2021 1424   TRIG 133 03/30/2021 1424   HDL 46 03/30/2021 1424   CHOLHDL 5.4 (H) 03/30/2021 1424   LDLCALC 174 (H) 03/30/2021 1424    CBC    Component Value Date/Time   WBC 8.3 03/30/2021 1424   RBC 5.21 03/30/2021 1424   HGB 16.6 03/30/2021 1424   HCT 47.7 03/30/2021 1424   PLT 271 03/30/2021 1424   MCV 91.6 03/30/2021 1424   MCH 31.9 03/30/2021 1424   MCHC 34.8 03/30/2021 1424   RDW 12.7 03/30/2021 1424    Hgb A1C Lab Results  Component Value Date   HGBA1C 5.4 03/30/2021           Assessment & Plan:   Fever, Body Aches, Nasal Congestion, Ear Fullness, Cough:  Rapid flu positive He is outside the window for antiviral therapy at this time Encourage rest and fluids Okay to continue ibuprofen, DayQuil or Sudafed OTC as needed  Schedule an appointment for annual exam Webb Silversmith, NP

## 2022-06-27 NOTE — Patient Instructions (Signed)

## 2022-07-07 ENCOUNTER — Ambulatory Visit: Payer: BC Managed Care – PPO | Admitting: Internal Medicine

## 2022-07-07 ENCOUNTER — Encounter: Payer: Self-pay | Admitting: Internal Medicine

## 2022-07-07 VITALS — BP 124/84 | HR 82 | Temp 96.6°F | Wt 257.0 lb

## 2022-07-07 DIAGNOSIS — J329 Chronic sinusitis, unspecified: Secondary | ICD-10-CM | POA: Diagnosis not present

## 2022-07-07 DIAGNOSIS — H65111 Acute and subacute allergic otitis media (mucoid) (sanguinous) (serous), right ear: Secondary | ICD-10-CM | POA: Diagnosis not present

## 2022-07-07 DIAGNOSIS — B9789 Other viral agents as the cause of diseases classified elsewhere: Secondary | ICD-10-CM

## 2022-07-07 MED ORDER — AMOXICILLIN-POT CLAVULANATE 875-125 MG PO TABS: 1.0000 | ORAL_TABLET | Freq: Two times a day (BID) | ORAL | 0 refills | Status: DC

## 2022-07-07 NOTE — Patient Instructions (Signed)

## 2022-07-07 NOTE — Progress Notes (Signed)
Subjective:    Patient ID: Matthew Barnett, male    DOB: 1980-08-17, 42 y.o.   MRN: [id number]  HPI  Patient presents to clinic today with complaint of sinus pressure, nasal congestion, ear pain, sore throat and cough. This started about 1 week ago.  He is blowing yellow mucus out of his nose.  He denies difficulty swallowing.  The cough is productive of yellow mucus.  He denies headache, runny nose, shortness of breath, chest pain, nausea, vomiting or diarrhea.  He denies fever, chills or bodyaches.  He has tried Sudafed OTC with minimal relief of symptoms.  He was seen on 2/26 for similar issues, diagnosed with the flu.  He was outside the window for Tamiflu at that time.  Review of Systems     No past medical history on file.  No current outpatient medications on file.   No current facility-administered medications for this visit.    Allergies  Allergen Reactions   Ceclor [Cefaclor]     No family history on file.  Social History   Socioeconomic History   Marital status: Married    Spouse name: Not on file   Number of children: Not on file   Years of education: Not on file   Highest education level: Not on file  Occupational History   Not on file  Tobacco Use   Smoking status: Never   Smokeless tobacco: Never  Vaping Use   Vaping Use: Never used  Substance and Sexual Activity   Alcohol use: Yes    Comment: occasional   Drug use: No   Sexual activity: Not on file  Other Topics Concern   Not on file  Social History Narrative   Not on file   Social Determinants of Health   Financial Resource Strain: Not on file  Food Insecurity: Not on file  Transportation Needs: Not on file  Physical Activity: Not on file  Stress: Not on file  Social Connections: Not on file  Intimate Partner Violence: Not on file     Constitutional: Denies fever, malaise, fatigue, headache or abrupt weight changes.  HEENT: Patient reports sinus pressure, nasal congestion, ear pain and  sore throat.  Denies eye pain, eye redness,  ringing in the ears, wax buildup, runny nose, bloody nose. Respiratory: Patient reports cough.  Denies difficulty breathing, shortness of breath.   Cardiovascular: Denies chest pain, chest tightness, palpitations or swelling in the hands or feet.  Gastrointestinal: Denies abdominal pain, bloating, constipation, diarrhea or blood in the stool.   No other specific complaints in a complete review of systems (except as listed in HPI above).  Objective:   Physical Exam  BP 124/84 (BP Location: Left Arm, Patient Position: Sitting, Cuff Size: Large)   Pulse 82   Temp (!) 96.6 F (35.9 C) (Temporal)   Wt 257 lb (116.6 kg)   SpO2 96%   BMI 34.86 kg/m     General: Appears his stated age, obese, appears unwell but in NAD. Skin: Warm, dry and intact.  HEENT: Head: normal shape and size, no maxillary or sinus tenderness noted; Eyes: sclera white, no icterus, conjunctiva pink, PERRLA and EOMs intact; Right Ear: Red and bulging, distorted light reflex, + mucoid effusion; Nose: mucosa pink and moist, septum midline; Throat/Mouth: Teeth present, mucosa pink and moist, + PND, no exudate, lesions or ulcerations noted.  Neck: No adenopathy noted. Cardiovascular: Normal rate and rhythm. S1,S2 noted.  No murmur, rubs or gallops noted.  Pulmonary/Chest: Normal effort and  positive vesicular breath sounds. No respiratory distress. No wheezes, rales or ronchi noted.  Neurological: Alert and oriented.   BMET    Component Value Date/Time   NA 140 03/30/2021 1424   K 4.3 03/30/2021 1424   CL 104 03/30/2021 1424   CO2 26 03/30/2021 1424   GLUCOSE 79 03/30/2021 1424   BUN 14 03/30/2021 1424   CREATININE 0.96 03/30/2021 1424   CALCIUM 9.6 03/30/2021 1424   GFRNONAA >60 02/10/2018 0159   GFRAA >60 02/10/2018 0159    Lipid Panel     Component Value Date/Time   CHOL 247 (H) 03/30/2021 1424   TRIG 133 03/30/2021 1424   HDL 46 03/30/2021 1424   CHOLHDL 5.4  (H) 03/30/2021 1424   LDLCALC 174 (H) 03/30/2021 1424    CBC    Component Value Date/Time   WBC 8.3 03/30/2021 1424   RBC 5.21 03/30/2021 1424   HGB 16.6 03/30/2021 1424   HCT 47.7 03/30/2021 1424   PLT 271 03/30/2021 1424   MCV 91.6 03/30/2021 1424   MCH 31.9 03/30/2021 1424   MCHC 34.8 03/30/2021 1424   RDW 12.7 03/30/2021 1424    Hgb A1C Lab Results  Component Value Date   HGBA1C 5.4 03/30/2021            Assessment & Plan:    Right Otitis Media, Viral Sinusitis:  Encourage rest and fluids Rx for Augmentin 875-125 mg twice daily x 10 days Recommend he start Zyrtec and Flonase OTC  Schedule an appointment for your annual exam Webb Silversmith, NP

## 2022-12-12 ENCOUNTER — Encounter: Payer: Self-pay | Admitting: Internal Medicine

## 2022-12-12 ENCOUNTER — Ambulatory Visit: Payer: BC Managed Care – PPO | Admitting: Internal Medicine

## 2022-12-12 VITALS — BP 128/66 | HR 80 | Temp 96.6°F | Ht 72.0 in | Wt 258.0 lb

## 2022-12-12 DIAGNOSIS — R7309 Other abnormal glucose: Secondary | ICD-10-CM | POA: Diagnosis not present

## 2022-12-12 DIAGNOSIS — Z0001 Encounter for general adult medical examination with abnormal findings: Secondary | ICD-10-CM

## 2022-12-12 DIAGNOSIS — Z6834 Body mass index (BMI) 34.0-34.9, adult: Secondary | ICD-10-CM

## 2022-12-12 DIAGNOSIS — E7800 Pure hypercholesterolemia, unspecified: Secondary | ICD-10-CM

## 2022-12-12 DIAGNOSIS — Z23 Encounter for immunization: Secondary | ICD-10-CM

## 2022-12-12 DIAGNOSIS — E6609 Other obesity due to excess calories: Secondary | ICD-10-CM | POA: Diagnosis not present

## 2022-12-12 MED ORDER — MUPIROCIN 2 % EX OINT: 1.0000 | TOPICAL_OINTMENT | Freq: Two times a day (BID) | CUTANEOUS | 0 refills | Status: DC

## 2022-12-12 NOTE — Assessment & Plan Note (Signed)
Encouraged diet and exercise for weight loss ?

## 2022-12-12 NOTE — Assessment & Plan Note (Signed)
Lipid profile today Encouraged him to consume a low fat diet

## 2022-12-12 NOTE — Patient Instructions (Signed)
Health Maintenance, Male Adopting a healthy lifestyle and getting preventive care are important in promoting health and wellness. Ask your health care provider about: The right schedule for you to have regular tests and exams. Things you can do on your own to prevent diseases and keep yourself healthy. What should I know about diet, weight, and exercise? Eat a healthy diet  Eat a diet that includes plenty of vegetables, fruits, low-fat dairy products, and lean protein. Do not eat a lot of foods that are high in solid fats, added sugars, or sodium. Maintain a healthy weight Body mass index (BMI) is a measurement that can be used to identify possible weight problems. It estimates body fat based on height and weight. Your health care provider can help determine your BMI and help you achieve or maintain a healthy weight. Get regular exercise Get regular exercise. This is one of the most important things you can do for your health. Most adults should: Exercise for at least 150 minutes each week. The exercise should increase your heart rate and make you sweat (moderate-intensity exercise). Do strengthening exercises at least twice a week. This is in addition to the moderate-intensity exercise. Spend less time sitting. Even light physical activity can be beneficial. Watch cholesterol and blood lipids Have your blood tested for lipids and cholesterol at 42 years of age, then have this test every 5 years. You may need to have your cholesterol levels checked more often if: Your lipid or cholesterol levels are high. You are older than 42 years of age. You are at high risk for heart disease. What should I know about cancer screening? Many types of cancers can be detected early and may often be prevented. Depending on your health history and family history, you may need to have cancer screening at various ages. This may include screening for: Colorectal cancer. Prostate cancer. Skin cancer. Lung  cancer. What should I know about heart disease, diabetes, and high blood pressure? Blood pressure and heart disease High blood pressure causes heart disease and increases the risk of stroke. This is more likely to develop in people who have high blood pressure readings or are overweight. Talk with your health care provider about your target blood pressure readings. Have your blood pressure checked: Every 3-5 years if you are 18-39 years of age. Every year if you are 40 years old or older. If you are between the ages of 65 and 75 and are a current or former smoker, ask your health care provider if you should have a one-time screening for abdominal aortic aneurysm (AAA). Diabetes Have regular diabetes screenings. This checks your fasting blood sugar level. Have the screening done: Once every three years after age 45 if you are at a normal weight and have a low risk for diabetes. More often and at a younger age if you are overweight or have a high risk for diabetes. What should I know about preventing infection? Hepatitis B If you have a higher risk for hepatitis B, you should be screened for this virus. Talk with your health care provider to find out if you are at risk for hepatitis B infection. Hepatitis C Blood testing is recommended for: Everyone born from 1945 through 1965. Anyone with known risk factors for hepatitis C. Sexually transmitted infections (STIs) You should be screened each year for STIs, including gonorrhea and chlamydia, if: You are sexually active and are younger than 42 years of age. You are older than 42 years of age and your   health care provider tells you that you are at risk for this type of infection. Your sexual activity has changed since you were last screened, and you are at increased risk for chlamydia or gonorrhea. Ask your health care provider if you are at risk. Ask your health care provider about whether you are at high risk for HIV. Your health care provider  may recommend a prescription medicine to help prevent HIV infection. If you choose to take medicine to prevent HIV, you should first get tested for HIV. You should then be tested every 3 months for as long as you are taking the medicine. Follow these instructions at home: Alcohol use Do not drink alcohol if your health care provider tells you not to drink. If you drink alcohol: Limit how much you have to 0-2 drinks a day. Know how much alcohol is in your drink. In the [place] one drink equals one 12 oz bottle of beer (355 mL), one 5 oz glass of wine (148 mL), or one 1 oz glass of hard liquor (44 mL). Lifestyle Do not use any products that contain nicotine or tobacco. These products include cigarettes, chewing tobacco, and vaping devices, such as e-cigarettes. If you need help quitting, ask your health care provider. Do not use street drugs. Do not share needles. Ask your health care provider for help if you need support or information about quitting drugs. General instructions Schedule regular health, dental, and eye exams. Stay current with your vaccines. Tell your health care provider if: You often feel depressed. You have ever been abused or do not feel safe at home. Summary Adopting a healthy lifestyle and getting preventive care are important in promoting health and wellness. Follow your health care provider's instructions about healthy diet, exercising, and getting tested or screened for diseases. Follow your health care provider's instructions on monitoring your cholesterol and blood pressure. This information is not intended to replace advice given to you by your health care provider. Make sure you discuss any questions you have with your health care provider. Document Revised: 09/07/2020 Document Reviewed: 09/07/2020 Elsevier Patient Education  2024 [employer]  

## 2022-12-12 NOTE — Progress Notes (Signed)
Subjective:    Patient ID: Matthew Barnett, male    DOB: 1980/08/19, 42 y.o.   MRN: [id number]  HPI  Patient presents the clinic today for his annual exam.  Flu: 04/2018 Tetanus: > 10 years ago COVID: x 2 Vision screening: as needed Dentist: as needed  Diet: He does eat meat. He consumes fruits and veggies. He does eat some fried foods. He drinks mostly flavored water, dt pepsi, coffee. Exercise: Walking, light weight lifting  Review of Systems     No past medical history on file.  Current Outpatient Medications  Medication Sig Dispense Refill   amoxicillin-clavulanate (AUGMENTIN) 875-125 MG tablet Take 1 tablet by mouth 2 (two) times daily. 20 tablet 0   No current facility-administered medications for this visit.    Allergies  Allergen Reactions   Ceclor [Cefaclor]     No family history on file.  Social History   Socioeconomic History   Marital status: Married    Spouse name: Not on file   Number of children: Not on file   Years of education: Not on file   Highest education level: Not on file  Occupational History   Not on file  Tobacco Use   Smoking status: Never   Smokeless tobacco: Never  Vaping Use   Vaping status: Never Used  Substance and Sexual Activity   Alcohol use: Yes    Comment: occasional   Drug use: No   Sexual activity: Not on file  Other Topics Concern   Not on file  Social History Narrative   Not on file   Social Determinants of Health   Financial Resource Strain: Not on file  Food Insecurity: Not on file  Transportation Needs: Not on file  Physical Activity: Not on file  Stress: Not on file  Social Connections: Not on file  Intimate Partner Violence: Not on file     Constitutional: Denies fever, malaise, fatigue, headache or abrupt weight changes.  HEENT: Patient reports bump inside his nose.  Denies eye pain, eye redness, ear pain, ringing in the ears, wax buildup, runny nose, nasal congestion, bloody nose, or sore  throat. Respiratory: Denies difficulty breathing, shortness of breath, cough or sputum production.   Cardiovascular: Denies chest pain, chest tightness, palpitations or swelling in the hands or feet.  Gastrointestinal: Patient reports intermittent reflux.  Denies abdominal pain, bloating, constipation, diarrhea or blood in the stool.  GU: Denies urgency, frequency, pain with urination, burning sensation, blood in urine, odor or discharge. Musculoskeletal: Denies decrease in range of motion, difficulty with gait, muscle pain or joint pain and swelling.  Skin: Denies redness, rashes, lesions or ulcercations.  Neurological: Denies dizziness, difficulty with memory, difficulty with speech or problems with balance and coordination.  Psych: Denies anxiety, depression, SI/HI.  No other specific complaints in a complete review of systems (except as listed in HPI above).  Objective:   Physical Exam BP 128/66 (BP Location: Left Arm, Patient Position: Sitting, Cuff Size: Large)   Pulse 80   Temp (!) 96.6 F (35.9 C) (Temporal)   Ht 6' (1.829 m)   Wt 258 lb (117 kg)   SpO2 96%   BMI 34.99 kg/m   Wt Readings from Last 3 Encounters:  07/07/22 257 lb (116.6 kg)  06/27/22 257 lb (116.6 kg)  03/30/21 240 lb 12.8 oz (109.2 kg)    General: Appears his stated age, obese, in NAD. Skin: Warm, dry and intact.  HEENT: Head: normal shape and size; Eyes: sclera white,  no icterus, conjunctiva pink, PERRLA and EOMs intact; Nose: mucosa pink and moist, septum midline, pustule noted inside left nostril;  Neck:  Neck supple, trachea midline. No masses, lumps or thyromegaly present.  Cardiovascular: Normal rate and rhythm. S1,S2 noted.  No murmur, rubs or gallops noted. No JVD or BLE edema.  Pulmonary/Chest: Normal effort and positive vesicular breath sounds. No respiratory distress. No wheezes, rales or ronchi noted.  Abdomen: Normal bowel sounds.  Musculoskeletal: Strength 5/5 BUE/BLE. No difficulty with  gait.  Neurological: Alert and oriented. Cranial nerves II-XII grossly intact. Coordination normal.  Psychiatric: Mood and affect normal. Behavior is normal. Judgment and thought content normal.    BMET    Component Value Date/Time   NA 140 03/30/2021 1424   K 4.3 03/30/2021 1424   CL 104 03/30/2021 1424   CO2 26 03/30/2021 1424   GLUCOSE 79 03/30/2021 1424   BUN 14 03/30/2021 1424   CREATININE 0.96 03/30/2021 1424   CALCIUM 9.6 03/30/2021 1424   GFRNONAA >60 02/10/2018 0159   GFRAA >60 02/10/2018 0159    Lipid Panel     Component Value Date/Time   CHOL 247 (H) 03/30/2021 1424   TRIG 133 03/30/2021 1424   HDL 46 03/30/2021 1424   CHOLHDL 5.4 (H) 03/30/2021 1424   LDLCALC 174 (H) 03/30/2021 1424    CBC    Component Value Date/Time   WBC 8.3 03/30/2021 1424   RBC 5.21 03/30/2021 1424   HGB 16.6 03/30/2021 1424   HCT 47.7 03/30/2021 1424   PLT 271 03/30/2021 1424   MCV 91.6 03/30/2021 1424   MCH 31.9 03/30/2021 1424   MCHC 34.8 03/30/2021 1424   RDW 12.7 03/30/2021 1424    Hgb A1C Lab Results  Component Value Date   HGBA1C 5.4 03/30/2021            Assessment & Plan:   Preventative health maintenance:  Encouraged him to get a flu shot in the fall Tetanus 2 days Encouraged him to get his COVID-vaccine Encouraged him to consume a balanced diet and exercise regimen Advised him to see a dentist annually Will check CBC, c-Met, lipid, A1c today  Nasal sore:  Rx for Bactroban 2% twice daily.  RTC in 1 year, sooner if needed Nicki Reaper, NP

## 2022-12-14 ENCOUNTER — Encounter: Payer: Self-pay | Admitting: Internal Medicine

## 2022-12-14 DIAGNOSIS — E7800 Pure hypercholesterolemia, unspecified: Secondary | ICD-10-CM

## 2022-12-14 MED ORDER — ATORVASTATIN CALCIUM 10 MG PO TABS: 10.0000 mg | ORAL_TABLET | Freq: Every day | ORAL | 1 refills | Status: DC

## 2023-02-06 DIAGNOSIS — D2272 Melanocytic nevi of left lower limb, including hip: Secondary | ICD-10-CM | POA: Diagnosis not present

## 2023-02-06 DIAGNOSIS — D225 Melanocytic nevi of trunk: Secondary | ICD-10-CM | POA: Diagnosis not present

## 2023-02-06 DIAGNOSIS — D2261 Melanocytic nevi of right upper limb, including shoulder: Secondary | ICD-10-CM | POA: Diagnosis not present

## 2023-02-06 DIAGNOSIS — D2262 Melanocytic nevi of left upper limb, including shoulder: Secondary | ICD-10-CM | POA: Diagnosis not present

## 2023-05-11 ENCOUNTER — Ambulatory Visit: Payer: BC Managed Care – PPO | Admitting: Internal Medicine

## 2023-05-11 VITALS — BP 124/70 | Ht 72.0 in | Wt 261.0 lb

## 2023-05-11 DIAGNOSIS — R7303 Prediabetes: Secondary | ICD-10-CM | POA: Diagnosis not present

## 2023-05-11 DIAGNOSIS — E66812 Obesity, class 2: Secondary | ICD-10-CM

## 2023-05-11 DIAGNOSIS — Z6835 Body mass index (BMI) 35.0-35.9, adult: Secondary | ICD-10-CM

## 2023-05-11 DIAGNOSIS — K921 Melena: Secondary | ICD-10-CM

## 2023-05-11 DIAGNOSIS — E7800 Pure hypercholesterolemia, unspecified: Secondary | ICD-10-CM | POA: Diagnosis not present

## 2023-05-11 DIAGNOSIS — E6609 Other obesity due to excess calories: Secondary | ICD-10-CM

## 2023-05-11 DIAGNOSIS — Z23 Encounter for immunization: Secondary | ICD-10-CM | POA: Diagnosis not present

## 2023-05-11 NOTE — Assessment & Plan Note (Signed)
 A1c today Encourage low-carb diet and exercise for weight loss

## 2023-05-11 NOTE — Progress Notes (Signed)
 Subjective:    Patient ID: Matthew Barnett, male    DOB: 1981-02-04, 43 y.o.   MRN: [id number]  HPI  Patient presents to clinic today for follow-up of chronic conditions.  Prediabetes: His last A1c was 5.7%, 10/2022.  He is not taking any oral diabetic medication at this time.  He does not check his sugars.  HLD: His last LDL was not calculated, triglycerides 413, 10/2022.  He denies myalgias on atorvastatin .  He does not consume a low-fat diet.  He also reports he noticed blood in his stool. He noticed this about 1 week ago. It was bright red. He has seen blood before when he wiped. He is not having bloody diarrhea, abdominal pain or constipation. He reports he thinks he has had hemorrhoids in the past. He has rectal itching but no pain. He has not tried any hemorrhoid treatments OTC. He has no family history of colon cancer.  Review of Systems     No past medical history on file.  Current Outpatient Medications  Medication Sig Dispense Refill   atorvastatin  (LIPITOR) 10 MG tablet Take 1 tablet (10 mg total) by mouth daily. 90 tablet 1   mupirocin  ointment (BACTROBAN ) 2 % Apply 1 Application topically 2 (two) times daily. 22 g 0   No current facility-administered medications for this visit.    Allergies  Allergen Reactions   Ceclor [Cefaclor]     Family History  Problem Relation Age of Onset   Healthy Mother    Healthy Father    Diabetes type I Maternal Grandmother    Diabetes type II Maternal Grandfather     Social History   Socioeconomic History   Marital status: Married    Spouse name: Not on file   Number of children: Not on file   Years of education: Not on file   Highest education level: Not on file  Occupational History   Not on file  Tobacco Use   Smoking status: Never   Smokeless tobacco: Never  Vaping Use   Vaping status: Never Used  Substance and Sexual Activity   Alcohol use: Yes    Comment: occasional   Drug use: No   Sexual activity: Not on  file  Other Topics Concern   Not on file  Social History Narrative   Not on file   Social Drivers of Health   Financial Resource Strain: Not on file  Food Insecurity: Not on file  Transportation Needs: Not on file  Physical Activity: Not on file  Stress: Not on file  Social Connections: Not on file  Intimate Partner Violence: Not on file     Constitutional: Denies fever, malaise, fatigue, headache or abrupt weight changes.  HEENT:  Denies eye pain, eye redness, ear pain, ringing in the ears, wax buildup, runny nose, nasal congestion, bloody nose, or sore throat. Respiratory: Denies difficulty breathing, shortness of breath, cough or sputum production.   Cardiovascular: Denies chest pain, chest tightness, palpitations or swelling in the hands or feet.  Gastrointestinal: Patient reports intermittent reflux, blood in stool.  Denies abdominal pain, bloating, constipation, diarrhea.  GU: Denies urgency, frequency, pain with urination, burning sensation, blood in urine, odor or discharge. Musculoskeletal: Denies decrease in range of motion, difficulty with gait, muscle pain or joint pain and swelling.  Skin: Denies redness, rashes, lesions or ulcercations.  Neurological: Denies dizziness, difficulty with memory, difficulty with speech or problems with balance and coordination.  Psych: Denies anxiety, depression, SI/HI.  No other specific complaints  in a complete review of systems (except as listed in HPI above).  Objective:   Physical Exam BP 124/70   Ht 6' (1.829 m)   Wt 261 lb (118.4 kg)   BMI 35.40 kg/m    Wt Readings from Last 3 Encounters:  12/12/22 258 lb (117 kg)  07/07/22 257 lb (116.6 kg)  06/27/22 257 lb (116.6 kg)    General: Appears his stated age, obese, in NAD. Skin: Warm, dry and intact.  Cardiovascular: Normal rate and rhythm. S1,S2 noted.  No murmur, rubs or gallops noted.  Pulmonary/Chest: Normal effort and positive vesicular breath sounds. No respiratory  distress. No wheezes, rales or ronchi noted.  Abdomen: Normal bowel sounds.  Rectal: Declined. Neurological: Alert and oriented.  BMET    Component Value Date/Time   NA 143 12/12/2022 1447   K 4.2 12/12/2022 1447   CL 105 12/12/2022 1447   CO2 28 12/12/2022 1447   GLUCOSE 81 12/12/2022 1447   BUN 15 12/12/2022 1447   CREATININE 0.95 12/12/2022 1447   CALCIUM  9.8 12/12/2022 1447   GFRNONAA >60 02/10/2018 0159   GFRAA >60 02/10/2018 0159    Lipid Panel     Component Value Date/Time   CHOL 258 (H) 12/12/2022 1447   TRIG 413 (H) 12/12/2022 1447   HDL 42 12/12/2022 1447   CHOLHDL 6.1 (H) 12/12/2022 1447   LDLCALC  12/12/2022 1447     Comment:     . LDL cholesterol not calculated. Triglyceride levels greater than 400 mg/dL invalidate calculated LDL results. . Reference range: <100 . Desirable range <100 mg/dL for primary prevention;   <70 mg/dL for patients with CHD or diabetic patients  with > or = 2 CHD risk factors. SABRA LDL-C is now calculated using the Martin-Hopkins  calculation, which is a validated novel method providing  better accuracy than the Friedewald equation in the  estimation of LDL-C.  Gladis APPLETHWAITE et al. SANDREA. 7986;689(80): 2061-2068  (http://education.[facility].com/faq/FAQ164)     CBC    Component Value Date/Time   WBC 9.5 12/12/2022 1447   RBC 4.98 12/12/2022 1447   HGB 15.3 12/12/2022 1447   HCT 45.6 12/12/2022 1447   PLT 289 12/12/2022 1447   MCV 91.6 12/12/2022 1447   MCH 30.7 12/12/2022 1447   MCHC 33.6 12/12/2022 1447   RDW 13.1 12/12/2022 1447    Hgb A1C Lab Results  Component Value Date   HGBA1C 5.7 (H) 12/12/2022            Assessment & Plan:   Blood in stool:  Likely hemorrhoids He declines Rx for Anusol suppositories at this time Avoid straining-consume a high-fiber diet and drink adequate amounts of water He declines rectal exam and Hemoccult today He wonders about referral to GI for colonoscopy for screening  earlier than 45, advised him we could likely only get this covered if rectal exam was positive for hemorrhoids were Hemoccult positive  RTC in 7 months for your annual exam Angeline Laura, NP

## 2023-05-11 NOTE — Assessment & Plan Note (Signed)
 Encouraged diet and exercise for weight loss ?

## 2023-05-11 NOTE — Assessment & Plan Note (Signed)
 Lipid profile today Continue atorvastatin Encouraged him to consume a low fat diet

## 2023-05-11 NOTE — Patient Instructions (Signed)
Prediabetes Eating Plan Prediabetes is a condition that causes blood sugar (glucose) levels to be higher than normal. This increases the risk for developing type 2 diabetes (type 2 diabetes mellitus). Working with a health care provider or nutrition specialist (dietitian) to make diet and lifestyle changes can help prevent the onset of diabetes. These changes may help you: Control your blood glucose levels. Improve your cholesterol levels. Manage your blood pressure. What are tips for following this plan? Reading food labels Read food labels to check the amount of fat, salt (sodium), and sugar in prepackaged foods. Avoid foods that have: Saturated fats. Trans fats. Added sugars. Avoid foods that have more than 300 milligrams (mg) of sodium per serving. Limit your sodium intake to less than 2,300 mg each day. Shopping Avoid buying pre-made and processed foods. Avoid buying drinks with added sugar. Cooking Cook with olive oil. Do not use butter, lard, or ghee. Bake, broil, grill, steam, or boil foods. Avoid frying. Meal planning  Work with your dietitian to create an eating plan that is right for you. This may include tracking how many calories you take in each day. Use a food diary, notebook, or mobile application to track what you eat at each meal. Consider following a Mediterranean diet. This includes: Eating several servings of fresh fruits and vegetables each day. Eating fish at least twice a week. Eating one serving each day of whole grains, beans, nuts, and seeds. Using olive oil instead of other fats. Limiting alcohol. Limiting red meat. Using nonfat or low-fat dairy products. Consider following a plant-based diet. This includes dietary choices that focus on eating mostly vegetables and fruit, grains, beans, nuts, and seeds. If you have high blood pressure, you may need to limit your sodium intake or follow a diet such as the DASH (Dietary Approaches to Stop Hypertension) eating  plan. The DASH diet aims to lower high blood pressure. Lifestyle Set weight loss goals with help from your health care team. It is recommended that most people with prediabetes lose 7% of their body weight. Exercise for at least 30 minutes 5 or more days a week. Attend a support group or seek support from a mental health counselor. Take over-the-counter and prescription medicines only as told by your health care provider. What foods are recommended? Fruits Berries. Bananas. Apples. Oranges. Grapes. Papaya. Mango. Pomegranate. Kiwi. Grapefruit. Cherries. Vegetables Lettuce. Spinach. Peas. Beets. Cauliflower. Cabbage. Broccoli. Carrots. Tomatoes. Squash. Eggplant. Herbs. Peppers. Onions. Cucumbers. Brussels sprouts. Grains Whole grains, such as whole-wheat or whole-grain breads, crackers, cereals, and pasta. Unsweetened oatmeal. Bulgur. Barley. Quinoa. Brown rice. Corn or whole-wheat flour tortillas or taco shells. Meats and other proteins Seafood. Poultry without skin. Lean cuts of pork and beef. Tofu. Eggs. Nuts. Beans. Dairy Low-fat or fat-free dairy products, such as yogurt, cottage cheese, and cheese. Beverages Water. Tea. Coffee. Sugar-free or diet soda. Seltzer water. Low-fat or nonfat milk. Milk alternatives, such as soy or almond milk. Fats and oils Olive oil. Canola oil. Sunflower oil. Grapeseed oil. Avocado. Walnuts. Sweets and desserts Sugar-free or low-fat pudding. Sugar-free or low-fat ice cream and other frozen treats. Seasonings and condiments Herbs. Sodium-free spices. Mustard. Relish. Low-salt, low-sugar ketchup. Low-salt, low-sugar barbecue sauce. Low-fat or fat-free mayonnaise. The items listed above may not be a complete list of recommended foods and beverages. Contact a dietitian for more information. What foods are not recommended? Fruits Fruits canned with syrup. Vegetables Canned vegetables. Frozen vegetables with butter or cream sauce. Grains Refined white  flour and flour   products, such as bread, pasta, snack foods, and cereals. Meats and other proteins Fatty cuts of meat. Poultry with skin. Breaded or fried meat. Processed meats. Dairy Full-fat yogurt, cheese, or milk. Beverages Sweetened drinks, such as iced tea and soda. Fats and oils Butter. Lard. Ghee. Sweets and desserts Baked goods, such as cake, cupcakes, pastries, cookies, and cheesecake. Seasonings and condiments Spice mixes with added salt. Ketchup. Barbecue sauce. Mayonnaise. The items listed above may not be a complete list of foods and beverages that are not recommended. Contact a dietitian for more information. Where to find more information American Diabetes Association: www.diabetes.org Summary You may need to make diet and lifestyle changes to help prevent the onset of diabetes. These changes can help you control blood sugar, improve cholesterol levels, and manage blood pressure. Set weight loss goals with help from your health care team. It is recommended that most people with prediabetes lose 7% of their body weight. Consider following a Mediterranean diet. This includes eating plenty of fresh fruits and vegetables, whole grains, beans, nuts, seeds, fish, and low-fat dairy, and using olive oil instead of other fats. This information is not intended to replace advice given to you by your health care provider. Make sure you discuss any questions you have with your health care provider. Document Revised: 07/18/2019 Document Reviewed: 07/18/2019 Elsevier Patient Education  2024 Elsevier Inc.  

## 2023-05-12 ENCOUNTER — Encounter: Payer: Self-pay | Admitting: Internal Medicine

## 2023-05-12 LAB — COMPLETE METABOLIC PANEL WITH GFR
AG Ratio: 1.3 (calc) (ref 1.0–2.5)
ALT: 42 U/L (ref 9–46)
AST: 34 U/L (ref 10–40)
Albumin: 4.6 g/dL (ref 3.6–5.1)
Alkaline phosphatase (APISO): 74 U/L (ref 36–130)
BUN: 17 mg/dL (ref 7–25)
CO2: 27 mmol/L (ref 20–32)
Calcium: 10.4 mg/dL — ABNORMAL HIGH (ref 8.6–10.3)
Chloride: 102 mmol/L (ref 98–110)
Creat: 1.03 mg/dL (ref 0.60–1.29)
Globulin: 3.6 g/dL (ref 1.9–3.7)
Glucose, Bld: 89 mg/dL (ref 65–99)
Potassium: 4.6 mmol/L (ref 3.5–5.3)
Sodium: 141 mmol/L (ref 135–146)
Total Bilirubin: 0.7 mg/dL (ref 0.2–1.2)
Total Protein: 8.2 g/dL — ABNORMAL HIGH (ref 6.1–8.1)
eGFR: 93 mL/min/{1.73_m2} (ref >=60)

## 2023-05-12 LAB — LIPID PANEL
Cholesterol: 192 mg/dL (ref <200)
HDL: 47 mg/dL (ref >=40)
LDL Cholesterol (Calc): 122 mg/dL — ABNORMAL HIGH
Non-HDL Cholesterol (Calc): 145 mg/dL — ABNORMAL HIGH (ref <130)
Total CHOL/HDL Ratio: 4.1 (calc) (ref <5.0)
Triglycerides: 119 mg/dL (ref <150)

## 2023-05-12 LAB — HEMOGLOBIN A1C
Hgb A1c MFr Bld: 5.7 %{Hb} — ABNORMAL HIGH (ref <5.7)
Mean Plasma Glucose: 117 mg/dL
eAG (mmol/L): 6.5 mmol/L

## 2023-05-12 MED ORDER — ATORVASTATIN CALCIUM 20 MG PO TABS: 20.0000 mg | ORAL_TABLET | Freq: Every day | ORAL | 1 refills | Status: DC

## 2023-11-29 ENCOUNTER — Other Ambulatory Visit: Payer: Self-pay | Admitting: Internal Medicine

## 2023-11-30 NOTE — Telephone Encounter (Signed)
 Requested Prescriptions  Pending Prescriptions Disp Refills   atorvastatin  (LIPITOR) 20 MG tablet [Pharmacy Med Name: ATORVASTATIN  20 MG TABLET] 90 tablet 1    Sig: TAKE 1 TABLET BY MOUTH EVERY DAY     Cardiovascular:  Antilipid - Statins Failed - 11/30/2023  7:45 AM      Failed - Valid encounter within last 12 months    Recent Outpatient Visits   None     Future Appointments             In 2 weeks Antonette Angeline ORN, NP [facility]            Failed - Lipid Panel in normal range within the last 12 months    Cholesterol  Date Value Ref Range Status  05/11/2023 192 <200 mg/dL Final   LDL Cholesterol (Calc)  Date Value Ref Range Status  05/11/2023 122 (H) mg/dL (calc) Final    Comment:    Reference range: <100 . Desirable range <100 mg/dL for primary prevention;   <70 mg/dL for patients with CHD or diabetic patients  with > or = 2 CHD risk factors. SABRA LDL-C is now calculated using the Martin-Hopkins  calculation, which is a validated novel method providing  better accuracy than the Friedewald equation in the  estimation of LDL-C.  Gladis APPLETHWAITE et al. SANDREA. 7986;689(80): 2061-2068  (http://education.[facility].com/faq/FAQ164)    HDL  Date Value Ref Range Status  05/11/2023 47 > OR = 40 mg/dL Final   Triglycerides  Date Value Ref Range Status  05/11/2023 119 <150 mg/dL Final         Passed - Patient is not pregnant

## 2023-12-15 ENCOUNTER — Encounter: Payer: Self-pay | Admitting: Internal Medicine

## 2023-12-19 ENCOUNTER — Encounter: Payer: Self-pay | Admitting: Internal Medicine

## 2023-12-19 ENCOUNTER — Ambulatory Visit (INDEPENDENT_AMBULATORY_CARE_PROVIDER_SITE_OTHER): Admitting: Internal Medicine

## 2023-12-19 VITALS — BP 132/80 | HR 79 | Ht 72.0 in | Wt 259.5 lb

## 2023-12-19 DIAGNOSIS — R6882 Decreased libido: Secondary | ICD-10-CM

## 2023-12-19 DIAGNOSIS — Z6835 Body mass index (BMI) 35.0-35.9, adult: Secondary | ICD-10-CM

## 2023-12-19 DIAGNOSIS — E6609 Other obesity due to excess calories: Secondary | ICD-10-CM

## 2023-12-19 DIAGNOSIS — E7800 Pure hypercholesterolemia, unspecified: Secondary | ICD-10-CM

## 2023-12-19 DIAGNOSIS — R7303 Prediabetes: Secondary | ICD-10-CM | POA: Diagnosis not present

## 2023-12-19 DIAGNOSIS — R002 Palpitations: Secondary | ICD-10-CM

## 2023-12-19 DIAGNOSIS — Z0001 Encounter for general adult medical examination with abnormal findings: Secondary | ICD-10-CM | POA: Diagnosis not present

## 2023-12-19 DIAGNOSIS — E66812 Obesity, class 2: Secondary | ICD-10-CM | POA: Diagnosis not present

## 2023-12-19 NOTE — Assessment & Plan Note (Signed)
 Encouraged diet and exercise for weight loss ?

## 2023-12-19 NOTE — Patient Instructions (Signed)
 Health Maintenance, Male  Adopting a healthy lifestyle and getting preventive care are important in promoting health and wellness. Ask your health care provider about:  The right schedule for you to have regular tests and exams.  Things you can do on your own to prevent diseases and keep yourself healthy.  What should I know about diet, weight, and exercise?  Eat a healthy diet    Eat a diet that includes plenty of vegetables, fruits, low-fat dairy products, and lean protein.  Do not eat a lot of foods that are high in solid fats, added sugars, or sodium.  Maintain a healthy weight  Body mass index (BMI) is a measurement that can be used to identify possible weight problems. It estimates body fat based on height and weight. Your health care provider can help determine your BMI and help you achieve or maintain a healthy weight.  Get regular exercise  Get regular exercise. This is one of the most important things you can do for your health. Most adults should:  Exercise for at least 150 minutes each week. The exercise should increase your heart rate and make you sweat (moderate-intensity exercise).  Do strengthening exercises at least twice a week. This is in addition to the moderate-intensity exercise.  Spend less time sitting. Even light physical activity can be beneficial.  Watch cholesterol and blood lipids  Have your blood tested for lipids and cholesterol at 43 years of age, then have this test every 5 years.  You may need to have your cholesterol levels checked more often if:  Your lipid or cholesterol levels are high.  You are older than 43 years of age.  You are at high risk for heart disease.  What should I know about cancer screening?  Many types of cancers can be detected early and may often be prevented. Depending on your health history and family history, you may need to have cancer screening at various ages. This may include screening for:  Colorectal cancer.  Prostate cancer.  Skin cancer.  Lung  cancer.  What should I know about heart disease, diabetes, and high blood pressure?  Blood pressure and heart disease  High blood pressure causes heart disease and increases the risk of stroke. This is more likely to develop in people who have high blood pressure readings or are overweight.  Talk with your health care provider about your target blood pressure readings.  Have your blood pressure checked:  Every 3-5 years if you are 43-43 years of age.  Every year if you are 3 years old or older.  If you are between the ages of 60 and 72 and are a current or former smoker, ask your health care provider if you should have a one-time screening for abdominal aortic aneurysm (AAA).  Diabetes  Have regular diabetes screenings. This checks your fasting blood sugar level. Have the screening done:  Once every three years after age 66 if you are at a normal weight and have a low risk for diabetes.  More often and at a younger age if you are overweight or have a high risk for diabetes.  What should I know about preventing infection?  Hepatitis B  If you have a higher risk for hepatitis B, you should be screened for this virus. Talk with your health care provider to find out if you are at risk for hepatitis B infection.  Hepatitis C  Blood testing is recommended for:  Everyone born from [birth year] through 1965.  Anyone  with known risk factors for hepatitis C.  Sexually transmitted infections (STIs)  You should be screened each year for STIs, including gonorrhea and chlamydia, if:  You are sexually active and are younger than 43 years of age.  You are older than 43 years of age and your health care provider tells you that you are at risk for this type of infection.  Your sexual activity has changed since you were last screened, and you are at increased risk for chlamydia or gonorrhea. Ask your health care provider if you are at risk.  Ask your health care provider about whether you are at high risk for HIV. Your health care provider  may recommend a prescription medicine to help prevent HIV infection. If you choose to take medicine to prevent HIV, you should first get tested for HIV. You should then be tested every 3 months for as long as you are taking the medicine.  Follow these instructions at home:  Alcohol use  Do not drink alcohol if your health care provider tells you not to drink.  If you drink alcohol:  Limit how much you have to 0-2 drinks a day.  Know how much alcohol is in your drink. In the [place] one drink equals one 12 oz bottle of beer (355 mL), one 5 oz glass of wine (148 mL), or one 1 oz glass of hard liquor (44 mL).  Lifestyle  Do not use any products that contain nicotine or tobacco. These products include cigarettes, chewing tobacco, and vaping devices, such as e-cigarettes. If you need help quitting, ask your health care provider.  Do not use street drugs.  Do not share needles.  Ask your health care provider for help if you need support or information about quitting drugs.  General instructions  Schedule regular health, dental, and eye exams.  Stay current with your vaccines.  Tell your health care provider if:  You often feel depressed.  You have ever been abused or do not feel safe at home.  Summary  Adopting a healthy lifestyle and getting preventive care are important in promoting health and wellness.  Follow your health care provider's instructions about healthy diet, exercising, and getting tested or screened for diseases.  Follow your health care provider's instructions on monitoring your cholesterol and blood pressure.  This information is not intended to replace advice given to you by your health care provider. Make sure you discuss any questions you have with your health care provider.  Document Revised: 09/07/2020 Document Reviewed: 09/07/2020  Elsevier Patient Education  2024 [employer].

## 2023-12-19 NOTE — Progress Notes (Signed)
 Subjective:    Patient ID: Matthew Barnett, male    DOB: 06-Aug-1980, 43 y.o.   MRN: [id number]  HPI  Patient presents the clinic today for his annual exam.  Flu: 05/2023 Tetanus: 12/2022 COVID: x 2 Vision screening: as needed Dentist: annually  Diet: He does eat meat. He consumes fruits and veggies. He does eat some fried foods. He drinks mostly flavored water, dt pepsi, coffee. Exercise: Walking, light weight lifting  Review of Systems     No past medical history on file.  Current Outpatient Medications  Medication Sig Dispense Refill   atorvastatin  (LIPITOR) 20 MG tablet TAKE 1 TABLET BY MOUTH EVERY DAY 90 tablet 1   No current facility-administered medications for this visit.    Allergies  Allergen Reactions   Ceclor [Cefaclor]     Family History  Problem Relation Age of Onset   Healthy Mother    Healthy Father    Diabetes type I Maternal Grandmother    Diabetes type II Maternal Grandfather     Social History   Socioeconomic History   Marital status: Married    Spouse name: Not on file   Number of children: Not on file   Years of education: Not on file   Highest education level: Bachelor's degree (e.g., BA, AB, BS)  Occupational History   Not on file  Tobacco Use   Smoking status: Never   Smokeless tobacco: Never  Vaping Use   Vaping status: Never Used  Substance and Sexual Activity   Alcohol use: Yes    Comment: occasional   Drug use: No   Sexual activity: Not on file  Other Topics Concern   Not on file  Social History Narrative   Not on file   Social Drivers of Health   Financial Resource Strain: Low Risk  (05/11/2023)   Overall Financial Resource Strain (CARDIA)    Difficulty of Paying Living Expenses: Not hard at all  Food Insecurity: No Food Insecurity (05/11/2023)   Hunger Vital Sign    Worried About Running Out of Food in the Last Year: Never true    Ran Out of Food in the Last Year: Never true  Transportation Needs: No Transportation  Needs (05/11/2023)   PRAPARE - Administrator, Civil Service (Medical): No    Lack of Transportation (Non-Medical): No  Physical Activity: Insufficiently Active (05/11/2023)   Exercise Vital Sign    Days of Exercise per Week: 3 days    Minutes of Exercise per Session: 20 min  Stress: Stress Concern Present (05/11/2023)   [employer] of Occupational Health - Occupational Stress Questionnaire    Feeling of Stress : To some extent  Social Connections: Unknown (05/11/2023)   Social Connection and Isolation Panel    Frequency of Communication with Friends and Family: Once a week    Frequency of Social Gatherings with Friends and Family: Patient declined    Attends Religious Services: More than 4 times per year    Active Member of [employer] or Organizations: Yes    Attends Banker Meetings: Patient declined    Marital Status: Married  Catering manager Violence: Not on file     Constitutional: Denies fever, malaise, fatigue, headache or abrupt weight changes.  HEENT: Denies eye pain, eye redness, ear pain, ringing in the ears, wax buildup, runny nose, nasal congestion, bloody nose, or sore throat. Respiratory: Denies difficulty breathing, shortness of breath, cough or sputum production.   Cardiovascular: Pt reports palpitations. Denies  chest pain, chest tightness, or swelling in the hands or feet.  Gastrointestinal: Denies abdominal pain, bloating, constipation, diarrhea or blood in the stool.  GU: Patient reports decreased libido.  Denies urgency, frequency, pain with urination, burning sensation, blood in urine, odor or discharge. Musculoskeletal: Denies decrease in range of motion, difficulty with gait, muscle pain or joint pain and swelling.  Skin: Denies redness, rashes, lesions or ulcercations.  Neurological: Denies dizziness, difficulty with memory, difficulty with speech or problems with balance and coordination.  Psych: Denies anxiety, depression, SI/HI.  No  other specific complaints in a complete review of systems (except as listed in HPI above).  Objective:   Physical Exam BP 132/80 (BP Location: Left Arm, Patient Position: Sitting, Cuff Size: Normal)   Pulse 79   Ht 6' (1.829 m)   Wt 259 lb 8 oz (117.7 kg)   SpO2 95%   BMI 35.19 kg/m    Wt Readings from Last 3 Encounters:  05/11/23 261 lb (118.4 kg)  12/12/22 258 lb (117 kg)  07/07/22 257 lb (116.6 kg)    General: Appears his stated age, obese, in NAD. Skin: Warm, dry and intact.  HEENT: Head: normal shape and size; Eyes: sclera white, no icterus, conjunctiva pink, PERRLA and EOMs intact;  Neck:  Neck supple, trachea midline. No masses, lumps or thyromegaly present.  Cardiovascular: Normal rate and rhythm. S1,S2 noted.  No murmur, rubs or gallops noted. No JVD or BLE edema.  Pulmonary/Chest: Normal effort and positive vesicular breath sounds. No respiratory distress. No wheezes, rales or ronchi noted.  Abdomen: Normal bowel sounds.  Musculoskeletal: Strength 5/5 BUE/BLE. No difficulty with gait.  Neurological: Alert and oriented. Cranial nerves II-XII grossly intact. Coordination normal.  Psychiatric: Mood and affect normal. Behavior is normal. Judgment and thought content normal.    BMET    Component Value Date/Time   NA 141 05/11/2023 1449   K 4.6 05/11/2023 1449   CL 102 05/11/2023 1449   CO2 27 05/11/2023 1449   GLUCOSE 89 05/11/2023 1449   BUN 17 05/11/2023 1449   CREATININE 1.03 05/11/2023 1449   CALCIUM  10.4 (H) 05/11/2023 1449   GFRNONAA >60 02/10/2018 0159   GFRAA >60 02/10/2018 0159    Lipid Panel     Component Value Date/Time   CHOL 192 05/11/2023 1449   TRIG 119 05/11/2023 1449   HDL 47 05/11/2023 1449   CHOLHDL 4.1 05/11/2023 1449   LDLCALC 122 (H) 05/11/2023 1449    CBC    Component Value Date/Time   WBC 9.5 12/12/2022 1447   RBC 4.98 12/12/2022 1447   HGB 15.3 12/12/2022 1447   HCT 45.6 12/12/2022 1447   PLT 289 12/12/2022 1447   MCV 91.6  12/12/2022 1447   MCH 30.7 12/12/2022 1447   MCHC 33.6 12/12/2022 1447   RDW 13.1 12/12/2022 1447    Hgb A1C Lab Results  Component Value Date   HGBA1C 5.7 (H) 05/11/2023            Assessment & Plan:   Preventative health maintenance:  Encouraged him to get a flu shot in the fall Tetanus UTD Encouraged him to get his COVID-vaccine Encouraged him to consume a balanced diet and exercise regimen Advised him to see a dentist annually Will check CBC, c-Met, lipid, A1c today  Palpitations:  Will check TSH today Consider ECG if symptoms persist or worsen  Decreased libido:  Discussed checking testosterone levels however after discussing risk associated with testosterone replacement, this is not something  he would be interested in  RTC in 6 months, follow-up chronic conditions Angeline Laura, NP

## 2023-12-20 ENCOUNTER — Ambulatory Visit: Payer: Self-pay | Admitting: Internal Medicine

## 2023-12-20 LAB — COMPREHENSIVE METABOLIC PANEL WITH GFR
AG Ratio: 1.5 (calc) (ref 1.0–2.5)
ALT: 44 U/L (ref 9–46)
AST: 26 U/L (ref 10–40)
Albumin: 4.9 g/dL (ref 3.6–5.1)
Alkaline phosphatase (APISO): 75 U/L (ref 36–130)
BUN: 12 mg/dL (ref 7–25)
CO2: 26 mmol/L (ref 20–32)
Calcium: 10.1 mg/dL (ref 8.6–10.3)
Chloride: 102 mmol/L (ref 98–110)
Creat: 0.95 mg/dL (ref 0.60–1.29)
Globulin: 3.3 g/dL (ref 1.9–3.7)
Glucose, Bld: 90 mg/dL (ref 65–99)
Potassium: 4.1 mmol/L (ref 3.5–5.3)
Sodium: 140 mmol/L (ref 135–146)
Total Bilirubin: 0.7 mg/dL (ref 0.2–1.2)
Total Protein: 8.2 g/dL — ABNORMAL HIGH (ref 6.1–8.1)
eGFR: 102 mL/min/1.73m2 (ref >=60)

## 2023-12-20 LAB — TSH: TSH: 2.37 m[IU]/L (ref 0.40–4.50)

## 2023-12-20 LAB — LIPID PANEL
Cholesterol: 218 mg/dL — ABNORMAL HIGH (ref <200)
HDL: 57 mg/dL (ref >=40)
LDL Cholesterol (Calc): 130 mg/dL — ABNORMAL HIGH
Non-HDL Cholesterol (Calc): 161 mg/dL — ABNORMAL HIGH (ref <130)
Total CHOL/HDL Ratio: 3.8 (calc) (ref <5.0)
Triglycerides: 169 mg/dL — ABNORMAL HIGH (ref <150)

## 2023-12-20 LAB — CBC
HCT: 49.9 % (ref 38.5–50.0)
Hemoglobin: 16.5 g/dL (ref 13.2–17.1)
MCH: 30.8 pg (ref 27.0–33.0)
MCHC: 33.1 g/dL (ref 32.0–36.0)
MCV: 93.1 fL (ref 80.0–100.0)
MPV: 10.7 fL (ref 7.5–12.5)
Platelets: 294 Thousand/uL (ref 140–400)
RBC: 5.36 Million/uL (ref 4.20–5.80)
RDW: 12.9 % (ref 11.0–15.0)
WBC: 11.2 Thousand/uL — ABNORMAL HIGH (ref 3.8–10.8)

## 2023-12-20 LAB — HEMOGLOBIN A1C
Hgb A1c MFr Bld: 5.7 % — ABNORMAL HIGH (ref <5.7)
Mean Plasma Glucose: 117 mg/dL
eAG (mmol/L): 6.5 mmol/L

## 2024-02-23 DIAGNOSIS — D225 Melanocytic nevi of trunk: Secondary | ICD-10-CM | POA: Diagnosis not present

## 2024-02-23 DIAGNOSIS — D2261 Melanocytic nevi of right upper limb, including shoulder: Secondary | ICD-10-CM | POA: Diagnosis not present

## 2024-02-23 DIAGNOSIS — D2262 Melanocytic nevi of left upper limb, including shoulder: Secondary | ICD-10-CM | POA: Diagnosis not present

## 2024-02-23 DIAGNOSIS — D2272 Melanocytic nevi of left lower limb, including hip: Secondary | ICD-10-CM | POA: Diagnosis not present

## 2024-03-26 ENCOUNTER — Telehealth: Payer: Self-pay | Admitting: Internal Medicine

## 2024-03-26 NOTE — Telephone Encounter (Unsigned)
 Copied from CRM [id number]. Topic: General - Billing Inquiry >> Mar 26, 2024  2:14 PM Tiffini S wrote: Reason for CRM: Patient called about appointment scheduled for 06/14/24- he would like to discuss the cost for the visit  Please call the patient back at [phone] to advise

## 2024-04-23 ENCOUNTER — Encounter

## 2024-04-23 ENCOUNTER — Encounter: Payer: Self-pay | Admitting: Internal Medicine

## 2024-04-23 DIAGNOSIS — M791 Myalgia, unspecified site: Secondary | ICD-10-CM | POA: Diagnosis not present

## 2024-04-23 DIAGNOSIS — J101 Influenza due to other identified influenza virus with other respiratory manifestations: Secondary | ICD-10-CM | POA: Diagnosis not present

## 2024-06-14 ENCOUNTER — Ambulatory Visit: Admitting: Internal Medicine
